# Patient Record
Sex: Female | Born: 1955 | Race: White | Hispanic: No | Marital: Single | Smoking: Current every day smoker
Health system: Southern US, Community
[De-identification: ages and names within clinical notes are randomized; demographics above are authoritative.]

## PROBLEM LIST (undated history)

## (undated) DIAGNOSIS — I10 Essential (primary) hypertension: Secondary | ICD-10-CM

## (undated) DIAGNOSIS — Q782 Osteopetrosis: Secondary | ICD-10-CM

## (undated) DIAGNOSIS — M549 Dorsalgia, unspecified: Secondary | ICD-10-CM

## (undated) DIAGNOSIS — K5900 Constipation, unspecified: Secondary | ICD-10-CM

## (undated) DIAGNOSIS — E079 Disorder of thyroid, unspecified: Secondary | ICD-10-CM

## (undated) DIAGNOSIS — E039 Hypothyroidism, unspecified: Secondary | ICD-10-CM

## (undated) HISTORY — PX: CHOLECYSTECTOMY: SHX55

## (undated) HISTORY — PX: ABDOMINAL HYSTERECTOMY: SHX81

## (undated) HISTORY — DX: Dorsalgia, unspecified: M54.9

## (undated) HISTORY — DX: Disorder of thyroid, unspecified: E07.9

## (undated) HISTORY — DX: Essential (primary) hypertension: I10

## (undated) HISTORY — PX: BACK SURGERY: SHX140

## (undated) HISTORY — DX: Osteopetrosis: Q78.2

---

## 2000-10-06 ENCOUNTER — Other Ambulatory Visit: Admission: RE | Admit: 2000-10-06 | Discharge: 2000-10-06 | Payer: Self-pay | Admitting: Obstetrics and Gynecology

## 2001-10-26 ENCOUNTER — Other Ambulatory Visit: Admission: RE | Admit: 2001-10-26 | Discharge: 2001-10-26 | Payer: Self-pay | Admitting: Obstetrics and Gynecology

## 2002-11-30 ENCOUNTER — Other Ambulatory Visit: Admission: RE | Admit: 2002-11-30 | Discharge: 2002-11-30 | Payer: Self-pay | Admitting: Obstetrics and Gynecology

## 2006-11-24 ENCOUNTER — Ambulatory Visit: Payer: Self-pay | Admitting: Psychiatry

## 2006-11-24 ENCOUNTER — Other Ambulatory Visit (HOSPITAL_COMMUNITY): Admission: RE | Admit: 2006-11-24 | Discharge: 2007-02-22 | Payer: Self-pay | Admitting: Psychiatry

## 2007-01-06 ENCOUNTER — Ambulatory Visit (HOSPITAL_COMMUNITY): Payer: Self-pay | Admitting: Psychiatry

## 2007-02-17 ENCOUNTER — Ambulatory Visit (HOSPITAL_COMMUNITY): Payer: Self-pay | Admitting: Psychiatry

## 2007-04-14 ENCOUNTER — Ambulatory Visit (HOSPITAL_COMMUNITY): Payer: Self-pay | Admitting: Psychiatry

## 2007-06-11 ENCOUNTER — Ambulatory Visit (HOSPITAL_COMMUNITY): Payer: Self-pay | Admitting: Psychiatry

## 2007-06-16 ENCOUNTER — Ambulatory Visit (HOSPITAL_COMMUNITY): Admission: RE | Admit: 2007-06-16 | Discharge: 2007-06-16 | Payer: Self-pay | Admitting: Family Medicine

## 2007-07-16 ENCOUNTER — Ambulatory Visit (HOSPITAL_COMMUNITY): Payer: Self-pay | Admitting: Psychiatry

## 2007-10-06 ENCOUNTER — Ambulatory Visit (HOSPITAL_COMMUNITY): Payer: Self-pay | Admitting: Psychiatry

## 2007-12-03 ENCOUNTER — Ambulatory Visit (HOSPITAL_COMMUNITY): Payer: Self-pay | Admitting: Psychiatry

## 2007-12-09 ENCOUNTER — Ambulatory Visit: Payer: Self-pay | Admitting: Cardiology

## 2007-12-31 ENCOUNTER — Ambulatory Visit (HOSPITAL_COMMUNITY): Payer: Self-pay | Admitting: Psychiatry

## 2008-02-18 ENCOUNTER — Ambulatory Visit (HOSPITAL_COMMUNITY): Payer: Self-pay | Admitting: Psychiatry

## 2008-04-05 ENCOUNTER — Ambulatory Visit (HOSPITAL_COMMUNITY): Payer: Self-pay | Admitting: Psychiatry

## 2008-06-30 ENCOUNTER — Ambulatory Visit (HOSPITAL_COMMUNITY): Payer: Self-pay | Admitting: Psychiatry

## 2008-08-25 ENCOUNTER — Ambulatory Visit (HOSPITAL_COMMUNITY): Payer: Self-pay | Admitting: Psychiatry

## 2008-11-24 ENCOUNTER — Ambulatory Visit (HOSPITAL_COMMUNITY): Payer: Self-pay | Admitting: Psychiatry

## 2008-12-28 ENCOUNTER — Inpatient Hospital Stay (HOSPITAL_COMMUNITY): Admission: RE | Admit: 2008-12-28 | Discharge: 2008-12-30 | Payer: Self-pay | Admitting: Orthopedic Surgery

## 2009-02-16 ENCOUNTER — Ambulatory Visit (HOSPITAL_COMMUNITY): Payer: Self-pay | Admitting: Psychiatry

## 2009-06-13 ENCOUNTER — Ambulatory Visit (HOSPITAL_COMMUNITY): Payer: Self-pay | Admitting: Psychiatry

## 2009-07-25 ENCOUNTER — Ambulatory Visit (HOSPITAL_COMMUNITY)
Admission: RE | Admit: 2009-07-25 | Discharge: 2009-07-25 | Payer: Self-pay | Source: Home / Self Care | Admitting: Family Medicine

## 2009-09-12 ENCOUNTER — Ambulatory Visit (HOSPITAL_COMMUNITY): Payer: Self-pay | Admitting: Psychiatry

## 2009-12-07 ENCOUNTER — Ambulatory Visit (HOSPITAL_COMMUNITY): Payer: Self-pay | Admitting: Psychiatry

## 2010-03-06 ENCOUNTER — Ambulatory Visit (HOSPITAL_COMMUNITY): Payer: Self-pay | Admitting: Psychiatry

## 2010-03-07 ENCOUNTER — Ambulatory Visit: Payer: Self-pay | Admitting: Internal Medicine

## 2010-03-07 ENCOUNTER — Ambulatory Visit (HOSPITAL_COMMUNITY)
Admission: RE | Admit: 2010-03-07 | Discharge: 2010-03-07 | Payer: Self-pay | Source: Home / Self Care | Attending: Internal Medicine | Admitting: Internal Medicine

## 2010-04-27 ENCOUNTER — Other Ambulatory Visit (HOSPITAL_COMMUNITY): Payer: Self-pay

## 2010-05-02 ENCOUNTER — Inpatient Hospital Stay: Admission: RE | Admit: 2010-05-02 | Payer: Self-pay | Source: Home / Self Care | Admitting: Orthopedic Surgery

## 2010-06-05 ENCOUNTER — Other Ambulatory Visit (HOSPITAL_COMMUNITY): Payer: Self-pay

## 2010-06-05 ENCOUNTER — Other Ambulatory Visit (HOSPITAL_COMMUNITY): Payer: Self-pay | Admitting: Orthopedic Surgery

## 2010-06-05 ENCOUNTER — Ambulatory Visit (HOSPITAL_COMMUNITY)
Admission: RE | Admit: 2010-06-05 | Discharge: 2010-06-05 | Disposition: A | Discharge status: Home / Self Care | Payer: Worker's Compensation | Source: Ambulatory Visit | Attending: Orthopedic Surgery | Admitting: Orthopedic Surgery

## 2010-06-05 ENCOUNTER — Encounter (HOSPITAL_COMMUNITY)
Admission: RE | Admit: 2010-06-05 | Discharge: 2010-06-05 | Disposition: A | Discharge status: Home / Self Care | Payer: Worker's Compensation | Source: Ambulatory Visit | Attending: Orthopedic Surgery | Admitting: Orthopedic Surgery

## 2010-06-05 ENCOUNTER — Encounter (HOSPITAL_COMMUNITY): Payer: Self-pay | Admitting: Psychiatry

## 2010-06-05 DIAGNOSIS — Z01818 Encounter for other preprocedural examination: Secondary | ICD-10-CM | POA: Insufficient documentation

## 2010-06-05 DIAGNOSIS — M549 Dorsalgia, unspecified: Secondary | ICD-10-CM | POA: Insufficient documentation

## 2010-06-05 DIAGNOSIS — Z01810 Encounter for preprocedural cardiovascular examination: Secondary | ICD-10-CM | POA: Insufficient documentation

## 2010-06-05 DIAGNOSIS — Z01812 Encounter for preprocedural laboratory examination: Secondary | ICD-10-CM | POA: Insufficient documentation

## 2010-06-05 LAB — CBC
HCT: 40.2 % (ref 36.0–46.0)
MCH: 30.3 pg (ref 26.0–34.0)
RBC: 4.49 MIL/uL (ref 3.87–5.11)
RDW: 13.6 % (ref 11.5–15.5)

## 2010-06-05 LAB — SURGICAL PCR SCREEN: Staphylococcus aureus: NEGATIVE

## 2010-06-05 LAB — BASIC METABOLIC PANEL
CO2: 26 mEq/L (ref 19–32)
Calcium: 9.9 mg/dL (ref 8.4–10.5)
Chloride: 104 mEq/L (ref 96–112)
Sodium: 139 mEq/L (ref 135–145)

## 2010-06-07 ENCOUNTER — Encounter (INDEPENDENT_AMBULATORY_CARE_PROVIDER_SITE_OTHER): Payer: Worker's Compensation | Admitting: Psychiatry

## 2010-06-07 DIAGNOSIS — F3289 Other specified depressive episodes: Secondary | ICD-10-CM

## 2010-06-07 DIAGNOSIS — F329 Major depressive disorder, single episode, unspecified: Secondary | ICD-10-CM

## 2010-06-13 ENCOUNTER — Inpatient Hospital Stay (HOSPITAL_COMMUNITY): Payer: Worker's Compensation

## 2010-06-13 ENCOUNTER — Inpatient Hospital Stay (HOSPITAL_COMMUNITY)
Admission: RE | Admit: 2010-06-13 | Discharge: 2010-06-15 | DRG: 455 | Disposition: A | Discharge status: Home Health | Payer: Worker's Compensation | Source: Ambulatory Visit | Attending: Orthopedic Surgery | Admitting: Orthopedic Surgery

## 2010-06-13 DIAGNOSIS — T84498A Other mechanical complication of other internal orthopedic devices, implants and grafts, initial encounter: Principal | ICD-10-CM | POA: Diagnosis present

## 2010-06-13 DIAGNOSIS — E039 Hypothyroidism, unspecified: Secondary | ICD-10-CM | POA: Diagnosis present

## 2010-06-13 DIAGNOSIS — Z472 Encounter for removal of internal fixation device: Secondary | ICD-10-CM

## 2010-06-13 DIAGNOSIS — F17200 Nicotine dependence, unspecified, uncomplicated: Secondary | ICD-10-CM | POA: Diagnosis present

## 2010-06-13 DIAGNOSIS — Y831 Surgical operation with implant of artificial internal device as the cause of abnormal reaction of the patient, or of later complication, without mention of misadventure at the time of the procedure: Secondary | ICD-10-CM | POA: Diagnosis present

## 2010-06-13 DIAGNOSIS — Y92009 Unspecified place in unspecified non-institutional (private) residence as the place of occurrence of the external cause: Secondary | ICD-10-CM

## 2010-06-13 DIAGNOSIS — Z01810 Encounter for preprocedural cardiovascular examination: Secondary | ICD-10-CM

## 2010-06-13 DIAGNOSIS — M47817 Spondylosis without myelopathy or radiculopathy, lumbosacral region: Secondary | ICD-10-CM | POA: Diagnosis present

## 2010-06-13 DIAGNOSIS — I10 Essential (primary) hypertension: Secondary | ICD-10-CM | POA: Diagnosis present

## 2010-06-13 DIAGNOSIS — Z01812 Encounter for preprocedural laboratory examination: Secondary | ICD-10-CM

## 2010-06-13 LAB — TYPE AND SCREEN: Antibody Screen: NEGATIVE

## 2010-06-15 ENCOUNTER — Inpatient Hospital Stay (HOSPITAL_COMMUNITY): Payer: Worker's Compensation

## 2010-06-19 NOTE — Discharge Summary (Signed)
  NAME:  Shannon Glover, Shannon Glover                 ACCOUNT NO.:  [id number]  MEDICAL RECORD NO.:  [id number]           PATIENT TYPE:  I  LOCATION:  5023                         FACILITY:  [facility]  PHYSICIAN:  Alvy Beal, MD    DATE OF BIRTH:  June 22, 1955  DATE OF ADMISSION:  06/13/2010 DATE OF DISCHARGE:  06/15/2010                              DISCHARGE SUMMARY   ADMITTING DIAGNOSES: 1. Failed back syndrome. 2. 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  DISCHARGE DIAGNOSES: 1. Failed back syndrome. 2. 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  HISTORY:  This is a very pleasant 55 year old woman who has had longstanding significant back pain.  Approximately year and a half to 2 years ago, she had a TLIF procedure done and did initially very well. In October 2010, she had her TLIF.  The patient subsequently started getting significant more pain.  She was found on followup to have a pseudoarthrosis at L5-S1 along with adjacent segment degenerative disease at L4-5.  After attempts at conservative management and failed to alleviate his symptoms, the decision was made to take the patient back to the operating room for revision fusion and extension to L4-5. All appropriate risks, benefits, and alternatives were discussed and consent was obtained.  Please refer to preoperative H and P from my office notes which are included in her hospital chart for specifics on her past medical, surgical, family, and social history.  HOSPITAL COURSE:  The patient had an uneventful surgery.  She had an exquisite at L4-5 with the insertion of new screws, exploration of her fusion, and revision spinal fusion L5-S1.  She had a new L4-5 posterolateral fusion.  The patient tolerated the procedure well.  There was no adverse intraoperative events.  On postop day #1, she was up and ambulating.  She had incisional pain but overall she is  doing quite well.  The patient was voiding spontaneously after the Foley was removed and was ambulating.  She will be discharged home with appropriate pain medications, muscle relaxers, and instructions.  She will follow up with me in 2 weeks for reevaluation and wound check.     Alvy Beal, MD     DDB/MEDQ  D:  06/15/2010  T:  06/15/2010  Job:  [id number]  Electronically Signed by Venita Lick MD on 06/19/2010 08:43:15 AM

## 2010-06-19 NOTE — Op Note (Signed)
NAME:  Shannon Glover, Shannon Glover                 ACCOUNT NO.:  [id number]  MEDICAL RECORD NO.:  [id number]           PATIENT TYPE:  I  LOCATION:  5023                         FACILITY:  [facility]  PHYSICIAN:  Alvy Beal, MD    DATE OF BIRTH:  13-May-1955  DATE OF PROCEDURE:  06/13/2010 DATE OF DISCHARGE:                              OPERATIVE REPORT   PREOPERATIVE DIAGNOSES: 1. Pseudoarthrosis from previous transforaminal lumbar interbody     fusion L5-S1 with failed back syndrome. 2. Adjacent segment degenerative facet arthrosis and back pain L4-5.  POSTOPERATIVE DIAGNOSES: 1. Pseudoarthrosis from previous transforaminal lumbar interbody     fusion L5-S1 with failed back syndrome. 2. Adjacent segment degenerative facet arthrosis and back pain L4-5.  OPERATIVE PROCEDURE: 1. Lateral retroperitoneal exposure for complete diskectomy (XLIF),     anterolateral interbody fusion L4-5 with the NuVasive lordotic size     10 x 50 cage packed with Osteocel and DBX. 2. Exploration of posterolateral fusion L5-S1. 3. Removal of pedicle screws L5-S1. 4. Reinsertion of new pedicle screws L5-S1 and extension to L4. 5. Posterolateral arthrodesis with chronOS and DBX bone L4-S1.  COMPLICATIONS:  None.  CONDITION:  Stable.  Neuromonitoring, intraoperatively remained within normal limits.  No adverse events.  HISTORY:  This is a very pleasant 54-year woman who approximately a year and a half to two years ago underwent ago a TLIF at L5-S1.  Initially, she had improvement, but then starting getting increasing back pain. Despite appropriate conservative care and management, her pain persisted.  A followup CT scan demonstrated a pseudoarthrosis at L5-S1. There is also evidence of advancing degenerative changes at L4-5.  As a result of her increased pain and failure to fuse, the decision was made to take her back to the operating room.  Initially, preoperatively, I thought I would do a revision TLIF at  L5-S1 and XLIF at L4-5.  However, intraoperatively, I noted that if I attempted to do the TLIF and I am unable to get a grafting or I cannot mobilize the previously placed cage to place a new cage, that I would have compromised my ability to put a posterolateral fusion mass, given this I elected not to do the TLIF at L5-S1.  In addition, there was a significant scar tissue formation and I was concerned about my ability to mobilize the neural while the same time preventing any injury to the neural structures.  Furthermore, based on the CT scan, there was no evidence of any significant foraminal compromise to warrant a decompressive procedure.  Given this, I elected to proceed with just doing a posterolateral arthrodesis rather than trying to revise the TLIF at L5-S1.  OPERATIVE NOTE:  The patient was brought to the operating room, placed supine on the operating table.  After successful induction of general anesthesia and endotracheal intubation, TED, SCDs and Foley were inserted.  The patient was turned onto the left decubitus side with the left side up.  All bony prominence were well padded.  She was taped down appropriately with the iliac crest just at the break of the bed. Appropriate neuromonitoring, needles were then  inserted, and then the bed was broken.  I then taped the patient into position and confirmed satisfactory alignment of the L4-5 level in both the AP and lateral planes using fluoroscopy.  Once this was done, the flank was prepped and draped in a standard fashion.  A time-out was then done to confirm patient procedure and the extremity and all other pertinent important data.  Once this was done, I made a 3-inch incision centered over the L4-5 disk space.  I dissected through the subcutaneous tissue down to the fascia of the external oblique, I incised this.  I then made a counter incision approximately one fingerbreadth away from the mid portion of my first incision  posteriorly.  I then dissected through to the posterior fascia, I popped through the posterior facet into the rectus sheath.  At this point, with my finger I was able to palpate the ventral aspect of the transverse process and began to sweep the retroperitoneal contents anteriorly.  I then brought my finger up to the primary incision and I identified it and then using my two incision technique safely guided the initial trocar down to the psoas muscle.  Once it was on top of the psoas, I connected it to the neuromonitoring and advanced in a circular fashion until I was able to identified a safe position to advance through the psoas and not to injure the lumbar plexus.  Once I was able to do this, I went transpsoas down to the disk space.  I confirmed satisfactory position in both the AP and lateral planes.  I then sequentially dilated again checking circumferentially to ensure that I was not endangering the lumbar plexus.  With the plexus and femoral nerve projected, I placed a self-retaining retractors in and then I tapped it into the disk space so would not move.  I then probed underneath each of the disk underneath to the blade to ensure that they were still not adversely affecting the lumbar plexus.  I then opened it up advanced in anteriorly, so I had an excellent visualization of the disk space.  At this point, I then incised the annulus with a 15 blade scalpel, then used a combination of curettes, push-pull, Kerrison rongeurs, pituitary rongeurs, to remove all the disk material.  Once I had a complete diskectomy and I could visualize the endplates, I scraped him again to ensure I had bleeding endplates.  I then used my Cobb to advance across and released the contralateral annulus.  Once this was done, I measured with trial implants and obtained a 10 lordotic 50-mm long NuVasive anterolateral cage.  This was packed with a combination of Osteocel and DBX.  I malleted this to the  appropriate depth.  I had an excellent fixation and was firmly secured.  Once this was completed, I then irrigated the wound copiously with normal saline, used bipolar electrocautery to obtain hemostasis and removed the retractor.  I then closed the fascia of the external oblique and then closed with a #1 Vicryl suture, superficial 2-0 Vicryl suture and 3-0 Monocryl for the skin.  At this time, the second incision I made from my finger to advance bluntly, I irrigated, closed with a layer of 2- 0 Vicryl and then a 3-0 Monocryl.  At this point in time, with the patient's lateral fusion complete, I removed the drapes, I placed the patient supine on the operating table and then brought in the supine Bendon frame.  The patient was transferred into the spine Jean Rosenthal  frame in the prone position, and the arms were placed overhead and well padded.  At this point, the back was prepped and draped in a standard fashion.  A second time-out was done to confirm this portion of the procedure.  On the left-hand side which was the previous I extended the [facility] approach for L5-S1 and dissected down until I could see the lateral aspect of the L3-4 facet complex and the L4 transverse process.  I used a high-speed bur to decorticate the transverse process and then I placed a Jamshidi needle right on the border where the transverse process met the facet.  I then advanced the Jamshidi needle through the pedicle and into the vertebral body.  This Jamshidi was advanced only with direct visualization, but also using neuromonitoring assistance.  A guidepin was placed to the Jamshidi, I then tapped with a 5-0 tap and then placed on the left-hand side a 45-mm 6.5 diameter pedicle screw.  I had good fixation, good purchase, it was satisfactory in the AP and lateral fluoro view and with neuromonitoring.  I repeated this procedure on the contralateral.  I then identified the L5 and S1 pedicle screws. I removed both  of these screws and explored the screw holes.  Both screw holes were satisfactory.  Because of the position of the L5 screw hole on the left side,  I elected not to reposition it because it was going to make connecting to the rod quite difficult.  I therefore packed it with bone graft.  At S1, I tapped with a 7-0 tap and placed an 8-0 x 40 mm long screw.  This had excellent purchase and when I stimulated both screws, there was no evidence of neural compromise.  At this point, I used a high-speed bur to decorticate the lateral recess and packed significant amount of bone graft which consisted of chronOS and DBX and the remaining portion of Osteocel into the posterolateral corner.  I then irrigated the wound copiously, closed the deep fascia with interrupted #1 Vicryl sutures, superficial 2-0 Vicryl suture and a 3-0 Monocryl for the skin.  On the contralateral side, I used the same technique, expanded up the [place] approach, dissected down to the lateral aspect and then identified the L5 and S1 pedicle screws and the L4 transverse process. I then used the Jamshidi needle and placed the needle through the pedicle and into the vertebral body.  Again, using fluoro and direct visualization, the Jamshidi was properly positioned.  A guide pin was placed and then I tapped with the 5-0 tap and placed a 40 mm 6.5 diameter screw.  At L5, I tapped with a 7-0 screw and placed an 8-0 x 40 mm length pedicle screw and I placed the same size pedicle screw at S1. At all times, after tapping, I did palpate with a ball tipped feeler and there was no evidence of breach and I stimulated each of the screws and I had satisfactory readings.  At this point, I contemplated proceeding with the decompression in order to do a TLIF.  I had great concern about my ability to get an adequate sized interbody graft into the space given the location of her current graft.  I was concerned even though there was no subsides  of my regional 51 graft, I was greatly concerned that if I could not get an adequately sized interbody graft, then I would have compromised my ability to put posterolateral bone.  At this point, with the posterolateral aspect widely  exposed, I elected not to do the interbody fixation.  I decorticated the transverse process of L5 and the sacral ala and the L4 and packed a significant amount of bone graft into the posterolateral aspect.  This in combination with a much larger pedicle screws, I felt as though I could get a solid fusion in the posterolateral aspect.  At this point, I irrigated, closed in the same fashion as I did in the contralateral side.  Again, because of concerns about length of surgery, the ability to get an adequate size graft, to get the interbody fusion, I elected not to place a new TLIF graft at L5-S1.  At this point, the patient was extubated, transferred to the PACU without incident.  At the end of the case,  all needle, sponge counts were correct.  The patient tolerated procedure well.  No adverse intraoperative events.     Alvy Beal, MD     DDB/MEDQ  D:  06/13/2010  T:  06/14/2010  Job:  [id number]  Electronically Signed by Venita Lick MD on 06/19/2010 08:43:13 AM

## 2010-06-21 LAB — CBC
HCT: 41.2 % (ref 36.0–46.0)
Hemoglobin: 14 g/dL (ref 12.0–15.0)
MCHC: 34 g/dL (ref 30.0–36.0)
MCV: 92.3 fL (ref 78.0–100.0)
RBC: 4.47 MIL/uL (ref 3.87–5.11)

## 2010-06-21 LAB — BASIC METABOLIC PANEL
CO2: 27 mEq/L (ref 19–32)
Calcium: 10.1 mg/dL (ref 8.4–10.5)
Chloride: 101 mEq/L (ref 96–112)
GFR calc Af Amer: >60 mL/min (ref >60)
Glucose, Bld: 99 mg/dL (ref 70–99)
Sodium: 136 mEq/L (ref 135–145)

## 2010-06-21 LAB — ABO/RH: ABO/RH(D): A POS

## 2010-07-25 ENCOUNTER — Other Ambulatory Visit (HOSPITAL_COMMUNITY): Payer: Self-pay | Admitting: Orthopedic Surgery

## 2010-07-25 ENCOUNTER — Ambulatory Visit (HOSPITAL_COMMUNITY)
Admission: RE | Admit: 2010-07-25 | Discharge: 2010-07-25 | Disposition: A | Discharge status: Home / Self Care | Payer: Worker's Compensation | Source: Ambulatory Visit | Attending: Orthopedic Surgery | Admitting: Orthopedic Surgery

## 2010-07-25 DIAGNOSIS — Z981 Arthrodesis status: Secondary | ICD-10-CM

## 2010-07-25 DIAGNOSIS — M545 Low back pain, unspecified: Secondary | ICD-10-CM | POA: Insufficient documentation

## 2010-07-25 DIAGNOSIS — M549 Dorsalgia, unspecified: Secondary | ICD-10-CM

## 2010-08-30 ENCOUNTER — Encounter (HOSPITAL_COMMUNITY): Payer: Self-pay | Admitting: Psychiatry

## 2010-08-30 ENCOUNTER — Encounter (INDEPENDENT_AMBULATORY_CARE_PROVIDER_SITE_OTHER): Payer: Worker's Compensation | Admitting: Psychiatry

## 2010-08-30 DIAGNOSIS — F331 Major depressive disorder, recurrent, moderate: Secondary | ICD-10-CM

## 2010-10-03 IMAGING — CR DG LUMBAR SPINE 2-3V
2 series · 2 of 2 positions shown · non-contrast
Comparison: None

CLINICAL DATA: Preop L5-S1.

LUMBAR SPINE - 2-3 VIEW

[view not recorded (1 of 2)]
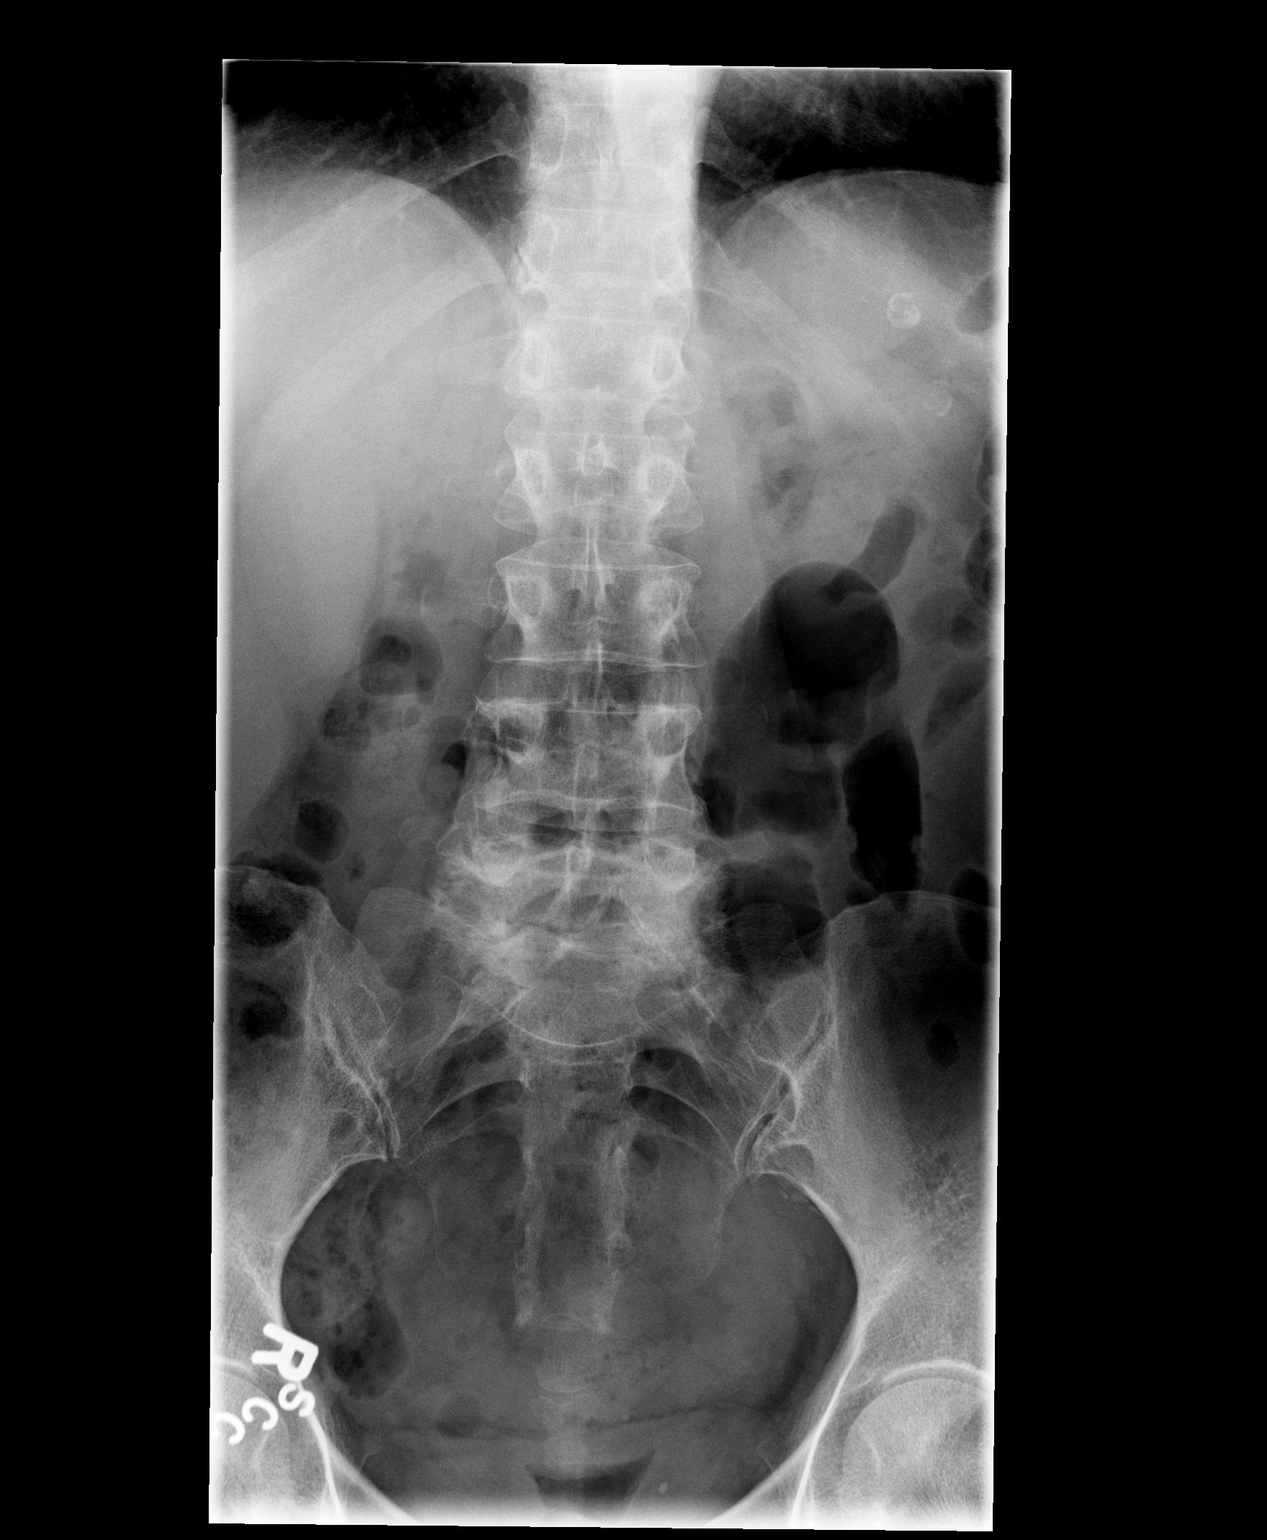

[view not recorded (2 of 2)]
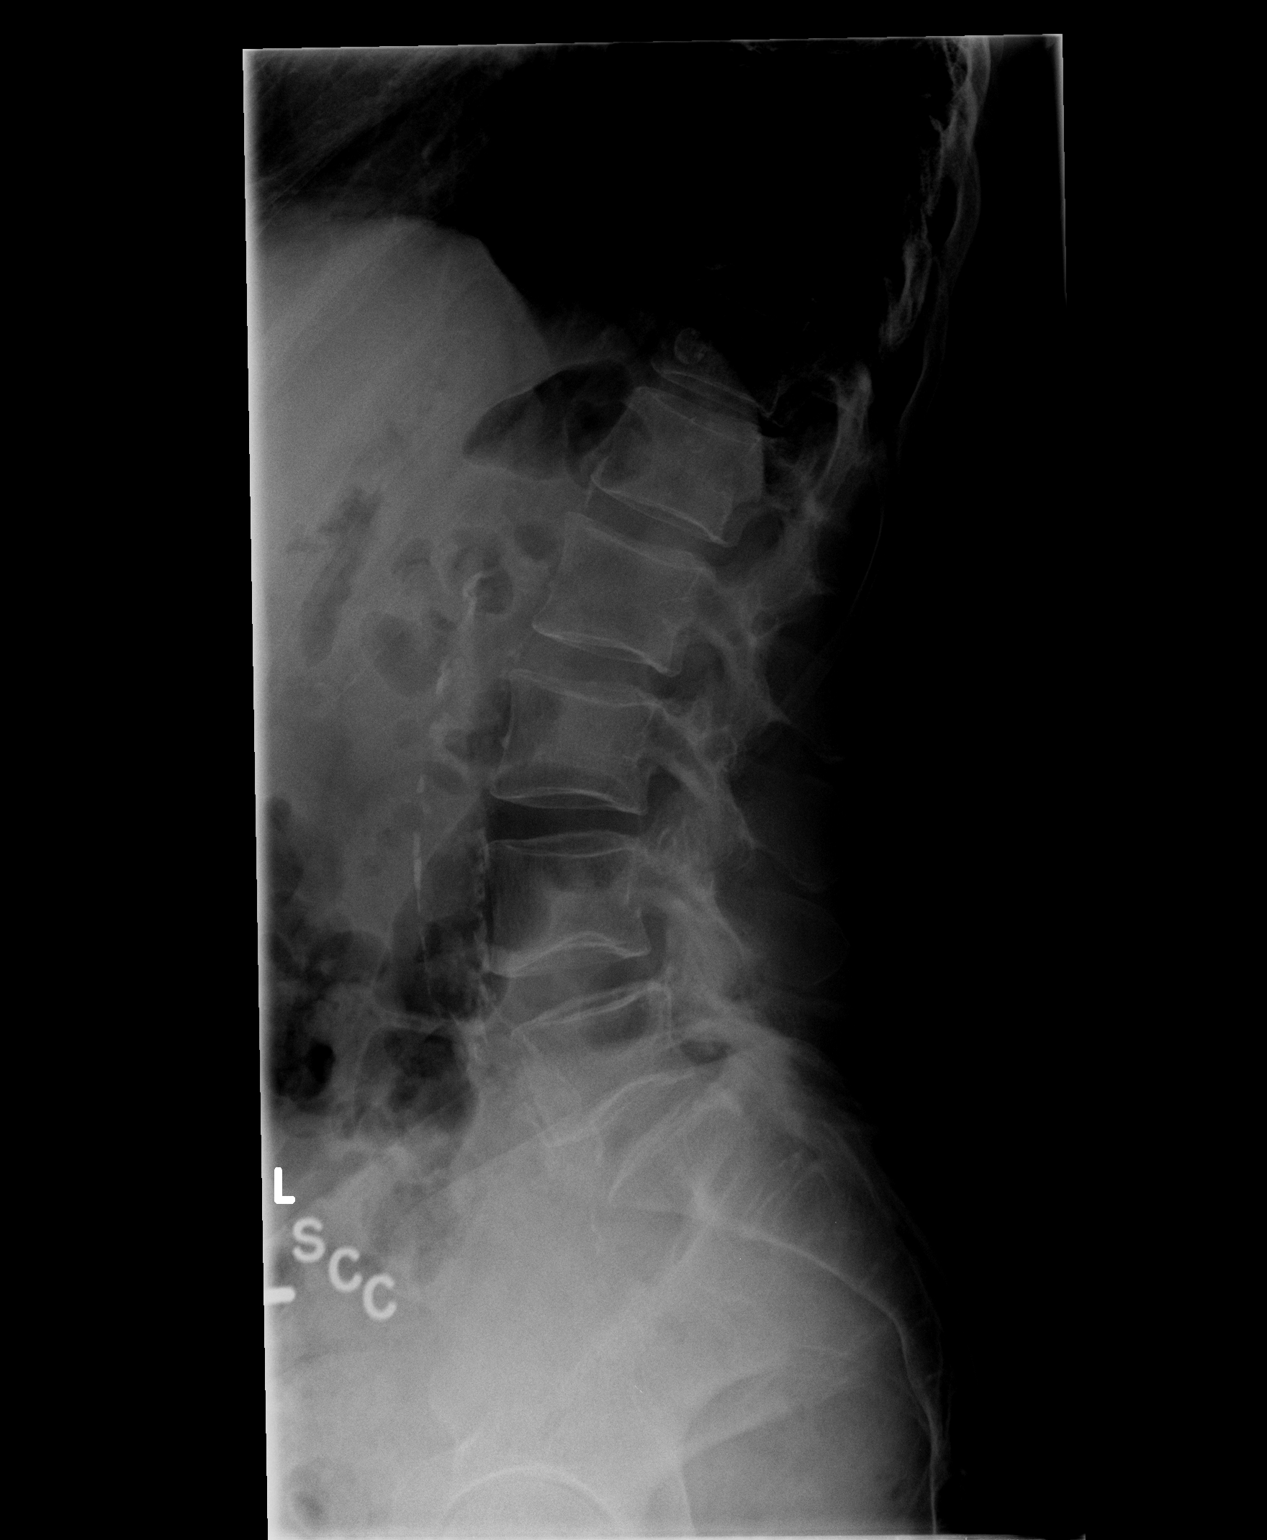

[2 of 2 positions shown; findings below may reference images not displayed]

FINDINGS: There are five lumbar-type vertebral bodies.  Mild facet
disease in the lower lumbar spine.  No fracture or malalignment.
SI joints are unremarkable.
IMPRESSION: No acute findings.

Degenerative facet disease in the lower lumbar spine.

## 2010-10-06 IMAGING — CR DG LUMBAR SPINE 2-3V
2 series · 2 of 2 positions shown · non-contrast
Comparison: 12/26/2008

CLINICAL DATA: L5-S1 fusion.

LUMBAR SPINE - 2-3 VIEW

[t l-spine a.p.]
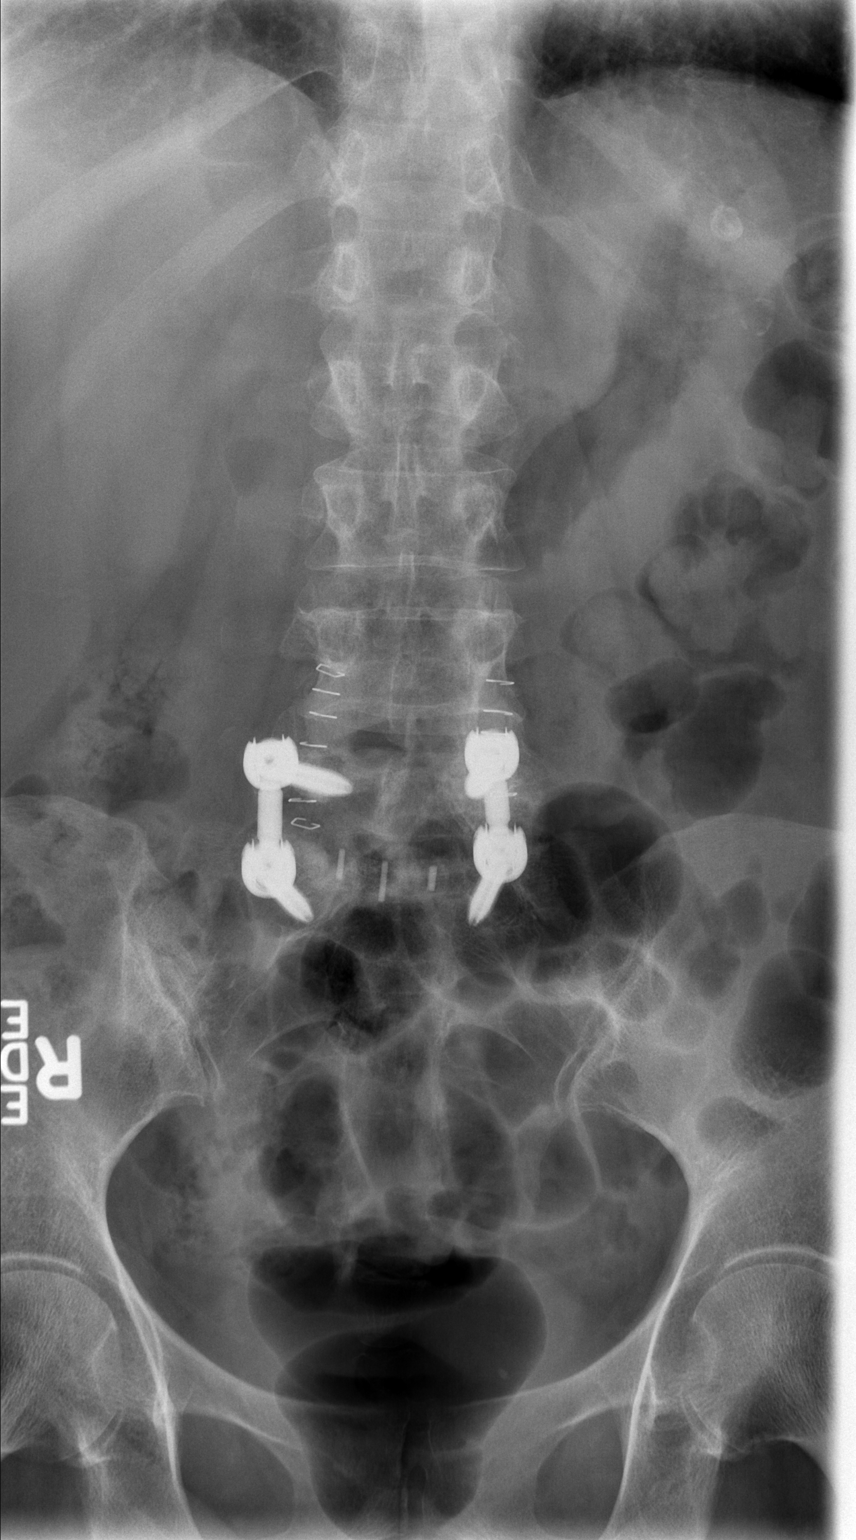

[t l-spine lat]
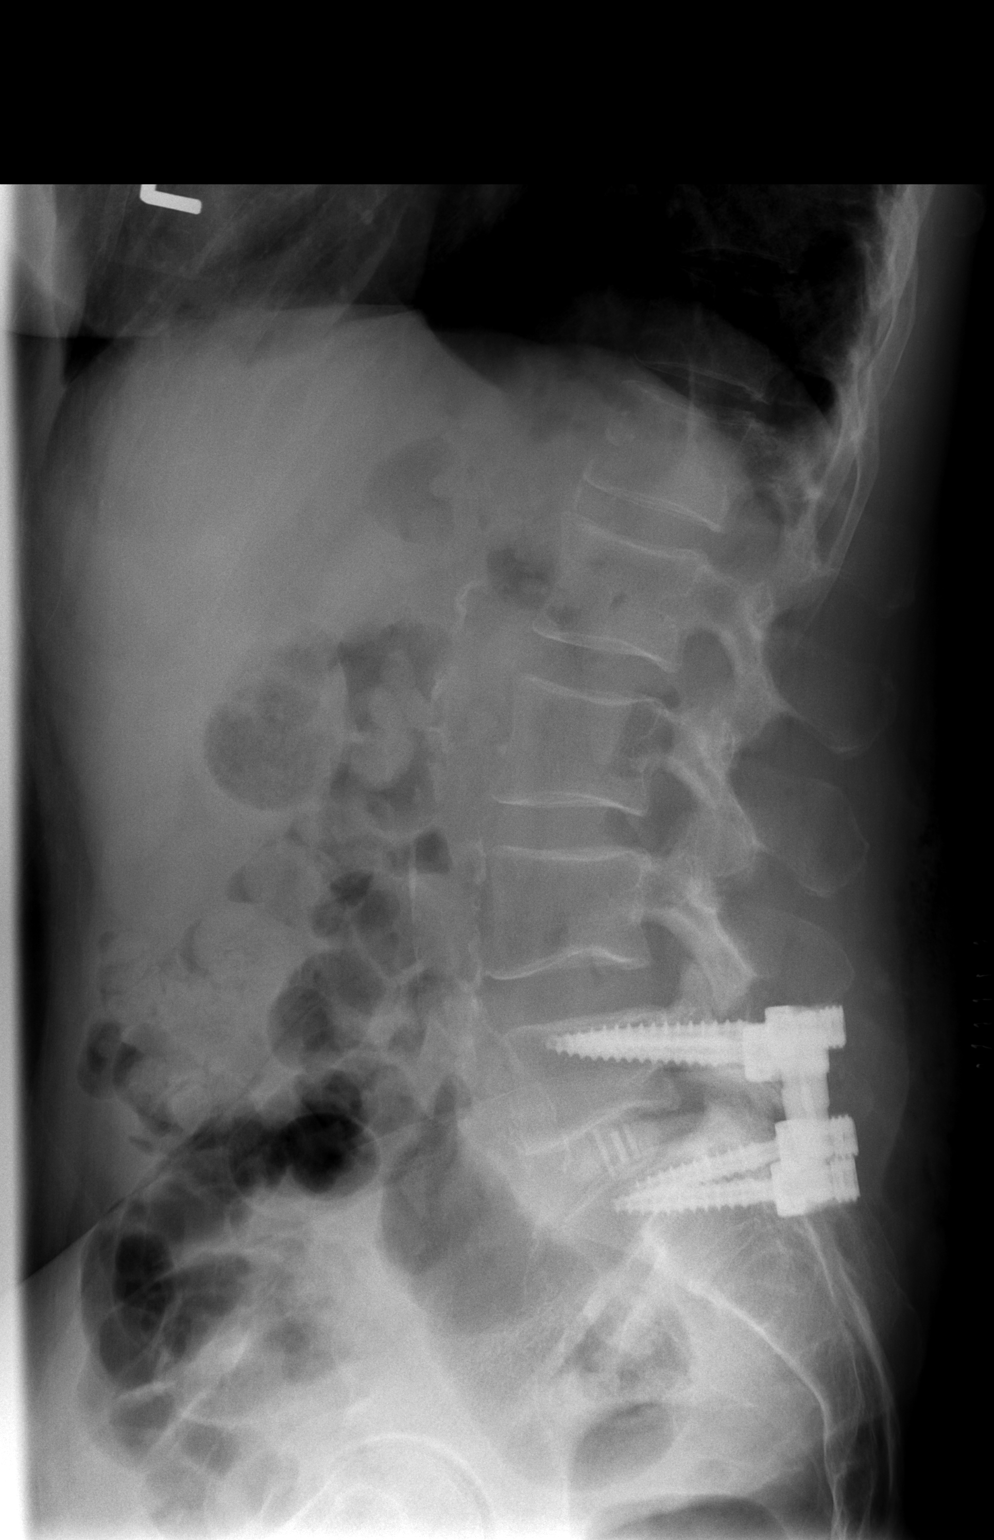

[2 of 2 positions shown; findings below may reference images not displayed]

FINDINGS: Postoperative changes noted from L5-S1 posterior fusion.
No hardware or bony complicating feature.  Stable alignment.

Atherosclerotic disease in the aorta without evidence of aneurysm.
IMPRESSION: Posterior fusion L5-S1.  No complicating feature.

## 2010-11-22 ENCOUNTER — Encounter (HOSPITAL_COMMUNITY): Payer: Self-pay | Admitting: Psychiatry

## 2010-12-13 ENCOUNTER — Encounter (INDEPENDENT_AMBULATORY_CARE_PROVIDER_SITE_OTHER): Payer: Worker's Compensation | Admitting: Psychiatry

## 2010-12-13 DIAGNOSIS — F329 Major depressive disorder, single episode, unspecified: Secondary | ICD-10-CM

## 2010-12-13 DIAGNOSIS — F3289 Other specified depressive episodes: Secondary | ICD-10-CM

## 2011-02-09 ENCOUNTER — Other Ambulatory Visit (HOSPITAL_COMMUNITY): Payer: Self-pay | Admitting: Psychiatry

## 2011-02-28 ENCOUNTER — Encounter (HOSPITAL_COMMUNITY): Payer: Self-pay | Admitting: Psychiatry

## 2011-02-28 ENCOUNTER — Ambulatory Visit (INDEPENDENT_AMBULATORY_CARE_PROVIDER_SITE_OTHER): Payer: Worker's Compensation | Admitting: Psychiatry

## 2011-02-28 VITALS — Wt 129.0 lb

## 2011-02-28 DIAGNOSIS — F329 Major depressive disorder, single episode, unspecified: Secondary | ICD-10-CM

## 2011-02-28 MED ORDER — BUPROPION HCL ER (SR) 150 MG PO TB12: 150.0000 mg | ORAL_TABLET | Freq: Every day | ORAL | Status: DC

## 2011-02-28 NOTE — Progress Notes (Signed)
Shannon [id number]  Subjective Patient came in for her followup appointment. She is under a lot of the stress due to financial burden. She is in the process of losing her insurance. She was told to get COBRA however patient could not afford COBRA. She has been not working since March. She has a back surgery in March and that did not permit her to go back to work. She is now thinking to applying for disability. She takes pain medication however there are days that she continued to have struggled to move her affect. She does not want to increase her Wellbutrin to recently reported that her sleep has not better. She was given trazodone by primary care physician which she has not started yet. She's wondering if she can take trazodone for sleep. She reported no side effects of Wellbutrin. She denies any agitation anger or mood swings.  Mental Status Examination Patient is casually dressed and fairly groomed. She described her mood as depressed and anxious. Her affect is constricted. She denies any active or passive suicidal thoughts or homicidal thoughts. Her attention and concentration is fair. Her speech is slow but linear and goal-directed. There no psychotic symptoms present. She denies any auditory or visual hallucination. She's alert and oriented x3. Her insight judgment and impulse control is okay  Current Medication Current outpatient prescriptions:buPROPion (WELLBUTRIN SR) 150 MG 12 hr tablet, Take 1 tablet (150 mg total) by mouth daily., Disp: 90 tablet, Rfl: 0;  fish oil-omega-3 fatty acids 1000 MG capsule, Take 2 g by mouth daily.  , Disp: , Rfl: ;  HYDROcodone-acetaminophen (NORCO) 10-325 MG per tablet, Take 1 tablet by mouth every 8 (eight) hours as needed.  , Disp: , Rfl:  levothyroxine (SYNTHROID, LEVOTHROID) 100 MCG tablet, Take 100 mcg by mouth daily.  , Disp: , Rfl: ;  spironolactone-hydrochlorothiazide (ALDACTAZIDE) 25-25 MG per tablet, Take 1 tablet by mouth daily.  , Disp: ,  Rfl: ;  traZODone (DESYREL) 50 MG tablet, Take 50 mg by mouth at bedtime.  , Disp: , Rfl: ;  Vitamin D, Ergocalciferol, (DRISDOL) 50000 UNITS CAPS, Take 50,000 Units by mouth.  , Disp: , Rfl:    Assessment Maj. depressive disorder  Plan I will continue her Wellbutrin at present dose but recommended to try trazodone 50 mg when necessary as needed for insomnia. I have explained risks and benefits of medication. I recommended to call us if she continued to have insomnia and if she feels her depression is getting worse. I explained risks and benefits of medication I will see her again in 6 weeks

## 2011-03-07 ENCOUNTER — Encounter (HOSPITAL_COMMUNITY): Payer: Self-pay | Admitting: Psychiatry

## 2011-04-09 ENCOUNTER — Ambulatory Visit (INDEPENDENT_AMBULATORY_CARE_PROVIDER_SITE_OTHER): Payer: Worker's Compensation | Admitting: Psychiatry

## 2011-04-09 DIAGNOSIS — F331 Major depressive disorder, recurrent, moderate: Secondary | ICD-10-CM

## 2011-04-09 MED ORDER — TRAZODONE HCL 100 MG PO TABS: 100.0000 mg | ORAL_TABLET | Freq: Every day | ORAL | Status: DC

## 2011-04-09 NOTE — Progress Notes (Signed)
Shannon [id number]  Subjective Patient came today for her followup appointment. She is now taking trazodone 50 mg which is helping her sleep however she continued to endorse at times racing thoughts and nervousness. She has recently seen her pain doctor however no new medication is added he did she continues to feel stress about her finances however she is relief as health Department agreed to provide medication.  She has been not working since March. She has a back surgery in March and that did not permit her to go back to work. She is still not sure about applying for disability. She reported no side effects of trazodone and wondering if the dose can be increased 200 mg. She denies any agitation anger or mood swings.  Mental Status Examination Patient is casually dressed and fairly groomed. She described her mood as depressed and anxious. Her affect is constricted. She denies any active or passive suicidal thoughts or homicidal thoughts. Her attention and concentration is fair. Her speech is slow but linear and goal-directed. There no psychotic symptoms present. She denies any auditory or visual hallucination. She's alert and oriented x3. Her insight judgment and impulse control is okay  Assessment Maj. depressive disorder  Plan I will continue her Wellbutrin at present dose but recommended to increase trazodone 100 mg. At this time patient is tolerating 50 mg without any side effects. I have explained risks and benefits of thinks sedation will increase trazodone. I recommended to call us if she has any question or concern about the medication or if she feels worsening of her symptoms. I will see her again in 2 months

## 2011-04-11 ENCOUNTER — Ambulatory Visit (HOSPITAL_COMMUNITY): Payer: Self-pay | Admitting: Psychiatry

## 2011-06-06 ENCOUNTER — Encounter (HOSPITAL_COMMUNITY): Payer: Self-pay | Admitting: Psychiatry

## 2011-06-06 ENCOUNTER — Ambulatory Visit (INDEPENDENT_AMBULATORY_CARE_PROVIDER_SITE_OTHER): Payer: Managed Care, Other (non HMO) | Admitting: Psychiatry

## 2011-06-06 VITALS — Wt 127.0 lb

## 2011-06-06 DIAGNOSIS — F329 Major depressive disorder, single episode, unspecified: Secondary | ICD-10-CM

## 2011-06-06 NOTE — Progress Notes (Signed)
Shannon [id number]  Chief complaint I am out of my Wellbutrin and hydrocodone.  History of present illness. Patient is 56 year old Caucasian female who came for her followup appointment. She is not taking Wellbutrin and hydrocodone. Her pain doctor refused to give pain medication due to some license issue. Patient endorsed significant withdrawal symptoms as she has to stop all of sudden. However, she was getting Lidoderm patch to the workers compensation. She's also not able to get Wellbutrin due to increased cost and unable to provide by health Department. She is taking trazodone 100 mg. She is feeling some sad and depression but does not want any other antidepressant. She likes trazodone and waiting for her insurance, so that she can go back on Wellbutrin. She is recently approved for her disability. However she is still waiting for final letter. Overall she is sleeping fair. She denies any agitation anger or mood swings. She denies any crying spells, but he still endorse social isolation and depressive thoughts. She denies any side effects of medication. She denies any active or passive suicidal thinking.  Current psychiatric medication. Trazodone 100 mg at bedtime.  Medical history. Patient has history of thyroid disease, hypertension, osteoporosis, and back pain. She see Dr.Yvas in [place].   Mental status examination. Patient is casually dressed and fairly groomed. She described her mood as depressed and anxious. Her affect is constricted. She is pleasant cooperative and maintained good eye contact. She denies any active or passive suicidal thoughts or homicidal thoughts. Her attention and concentration is fair. Her speech is slow but linear and goal-directed. There no psychotic symptoms present. She denies any auditory or visual hallucination. She's alert and oriented x3. Her insight judgment and impulse control is okay  Assessment Axis I Maj. depressive disorder Axis II  deferred. Axis III hypertension, thyroid problems, osteoporosis, and back pain. Axis IV mild to moderate  Plan At this time. Patient like to continue her trazodone 100 mg at bedtime. Patient has given refills from her pain doctor. Recently. She does not need any new medication. Patient is tolerating trazodone without any side effects. She will consider getting Wellbutrin once her insurance get finally approved. I recommended to call us if she feels worsening of her symptoms or any time having suicidal thinking and homicidal thinking. I will see her again in 6 weeks.

## 2011-07-18 ENCOUNTER — Encounter (HOSPITAL_COMMUNITY): Payer: Self-pay | Admitting: Psychiatry

## 2011-07-18 ENCOUNTER — Ambulatory Visit (INDEPENDENT_AMBULATORY_CARE_PROVIDER_SITE_OTHER): Payer: Managed Care, Other (non HMO) | Admitting: Psychiatry

## 2011-07-18 DIAGNOSIS — F331 Major depressive disorder, recurrent, moderate: Secondary | ICD-10-CM

## 2011-07-18 DIAGNOSIS — F3289 Other specified depressive episodes: Secondary | ICD-10-CM

## 2011-07-18 DIAGNOSIS — F329 Major depressive disorder, single episode, unspecified: Secondary | ICD-10-CM

## 2011-07-18 DIAGNOSIS — F32A Depression, unspecified: Secondary | ICD-10-CM | POA: Insufficient documentation

## 2011-07-18 MED ORDER — TRAZODONE HCL 100 MG PO TABS: 100.0000 mg | ORAL_TABLET | Freq: Every day | ORAL | Status: DC

## 2011-07-18 NOTE — Progress Notes (Signed)
Chief complaint Medication management followup.  History of present illness. Patient is 56 year old Caucasian female who came for her followup appointment.  She has recently seen her pain doctor Dr. Terese Door .  She was given Motrin and Lidoderm patch which is helping her pain.  She likes trazodone as she is able to sleep better.  She denies any crying spells however she continues to have chronic back pain.  She was taking Wellbutrin however she lost insurance and could not afford Wellbutrin.  She is hoping that her case will be several in September.  She was involved in a back injury.  She's recently approved for Social Security disability however she has not seen any payment.  She's excited that she is going to [place] to visit her family.  Overall she is stable on her trazodone.  She denies any crying spells agitation anger or mood swings.  She sleeps better.  She's not drinking or using any illegal substance.  Current psychiatric medication. Trazodone 100 mg at bedtime.    Past psychiatric history Patient has been seeing in this office since 2008.  She had overdose on Xanax and Vicodin in 2008.  She did intensive outpatient program.  She denies any history of psychiatric inpatient treatment.  In the past she had tried Zoloft and Wellbutrin.  She likes Wellbutrin however she could not afford.  Patient has a history of mania or psychosis.  Medical history. Patient has history of thyroid disease, hypertension, osteoporosis, and back pain. She see physician at [facility].    Mental status examination. Patient is casually dressed and fairly groomed.  She is pleasant calm and cooperative.  She described her mood is okay and her affect is neutral.  She maintained good eye contact. She denies any active or passive suicidal thoughts or homicidal thoughts. Her attention and concentration is fair. Her speech is slow but linear and goal-directed. There no psychotic symptoms present. She denies any auditory or  visual hallucination. She's alert and oriented x3. Her insight judgment and impulse control is okay  Assessment Axis I Maj. depressive disorder Axis II deferred. Axis III hypertension, thyroid problems, osteoporosis, and back pain. Axis IV mild to moderate  Plan I will continue trazodone 100 mg at bedtime.  Patient will also see her pain doctor regularly.  I explained the risk and benefits of medication.  I recommend to call us if she any question or concern or if she feels worsening of the symptoms.  I will see her again in 3 months.

## 2011-10-17 ENCOUNTER — Encounter (HOSPITAL_COMMUNITY): Payer: Self-pay | Admitting: Psychiatry

## 2011-10-17 ENCOUNTER — Ambulatory Visit (INDEPENDENT_AMBULATORY_CARE_PROVIDER_SITE_OTHER): Payer: Self-pay | Admitting: Psychiatry

## 2011-10-17 DIAGNOSIS — F329 Major depressive disorder, single episode, unspecified: Secondary | ICD-10-CM

## 2011-10-17 DIAGNOSIS — F331 Major depressive disorder, recurrent, moderate: Secondary | ICD-10-CM

## 2011-10-17 MED ORDER — TRAZODONE HCL 100 MG PO TABS: 100.0000 mg | ORAL_TABLET | Freq: Every day | ORAL | Status: DC

## 2011-10-17 NOTE — Progress Notes (Signed)
Chief complaint Medication management followup.  History of present illness. Patient is 56 year old Caucasian female who came for her followup appointment.  She is seeing Dr. Terese Door for her pain management .  She is taking Motrin and Lidoderm patch which is helping her pain.  She is compliant with trazodone which is helping her sleep.  She still struggling to get her disability approved.  She's writing a letter to expedite the process.  Overall her depression remains stable.  She denies any agitation anger mood swing.  She denies any hallucination or paranoid thinking.  She denies any side effects of medication.  Current psychiatric medication. Trazodone 100 mg at bedtime.    Past psychiatric history Patient has been seeing in this office since 2008.  She had overdose on Xanax and Vicodin in 2008.  She did intensive outpatient program.  She denies any history of psychiatric inpatient treatment.  In the past she had tried Zoloft and Wellbutrin.  She likes Wellbutrin however she could not afford.  Patient has a history of mania or psychosis.  Medical history. Patient has history of thyroid disease, hypertension, osteoporosis, and back pain. She see physician at [facility].    Mental status examination. Patient is casually dressed and fairly groomed.  She is pleasant calm and cooperative.  She described her mood is okay and her affect is neutral.  She maintained good eye contact. She denies any active or passive suicidal thoughts or homicidal thoughts. Her attention and concentration is fair. Her speech is slow but linear and goal-directed. There no psychotic symptoms present. She denies any auditory or visual hallucination. She's alert and oriented x3. Her insight judgment and impulse control is okay  Assessment Axis I Maj. depressive disorder Axis II deferred. Axis III hypertension, thyroid problems, osteoporosis, and back pain. Axis IV mild to moderate  Plan I will continue trazodone 100 mg  at bedtime.  Patient will also see her pain doctor regularly.  I explained the risk and benefits of medication.  I recommend to call us if she any question or concern or if she feels worsening of the symptoms.  I will see her again in 3 months.

## 2011-12-11 DIAGNOSIS — R079 Chest pain, unspecified: Secondary | ICD-10-CM

## 2012-01-16 ENCOUNTER — Ambulatory Visit (HOSPITAL_COMMUNITY): Payer: Self-pay | Admitting: Psychiatry

## 2012-03-22 IMAGING — CR DG LUMBAR SPINE 2-3V
2 series · 2 of 2 positions shown · non-contrast
Comparison: Intraoperative C-arm spot films of 06/13/2010

CLINICAL DATA: Postop back pain

LUMBAR SPINE - 2-3 VIEW

[w l-spine a.p.]
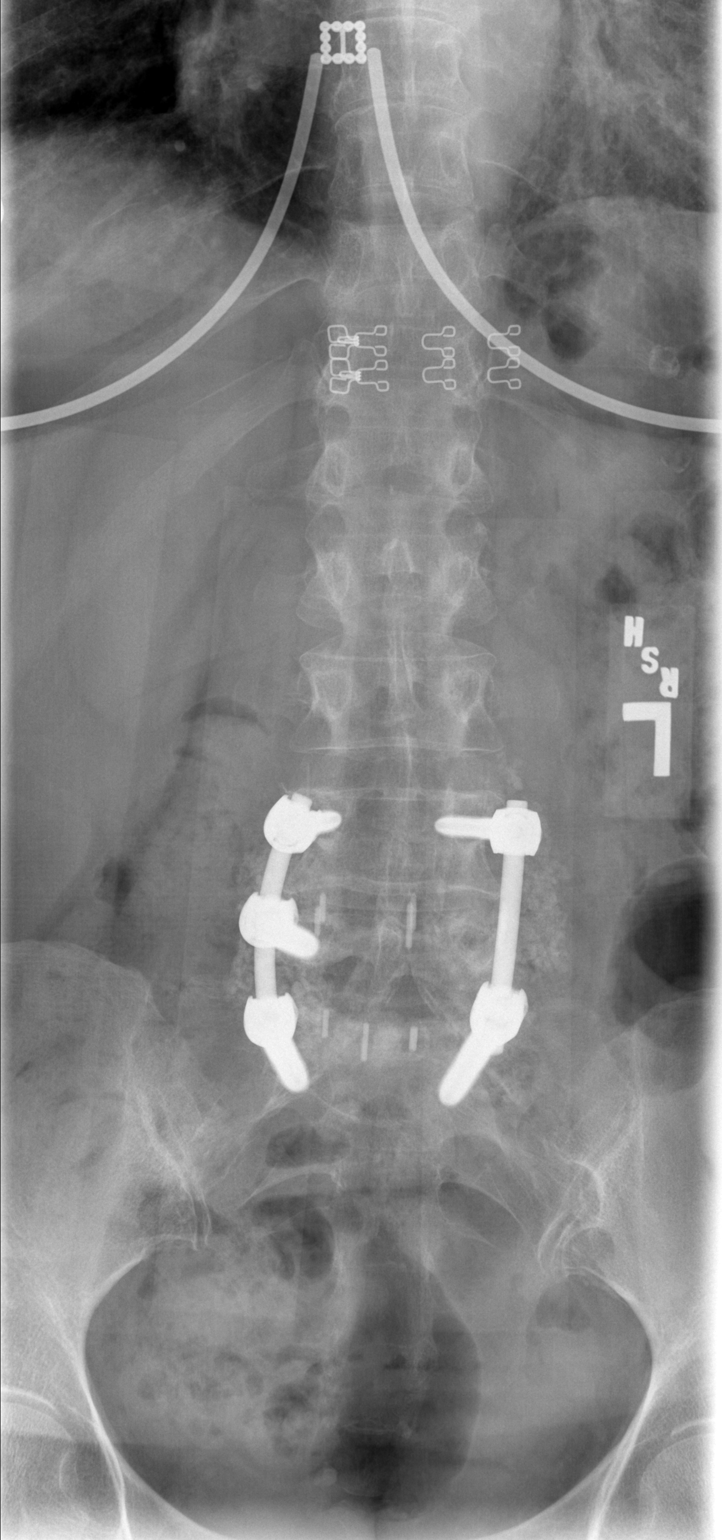

[w l-spine lat]
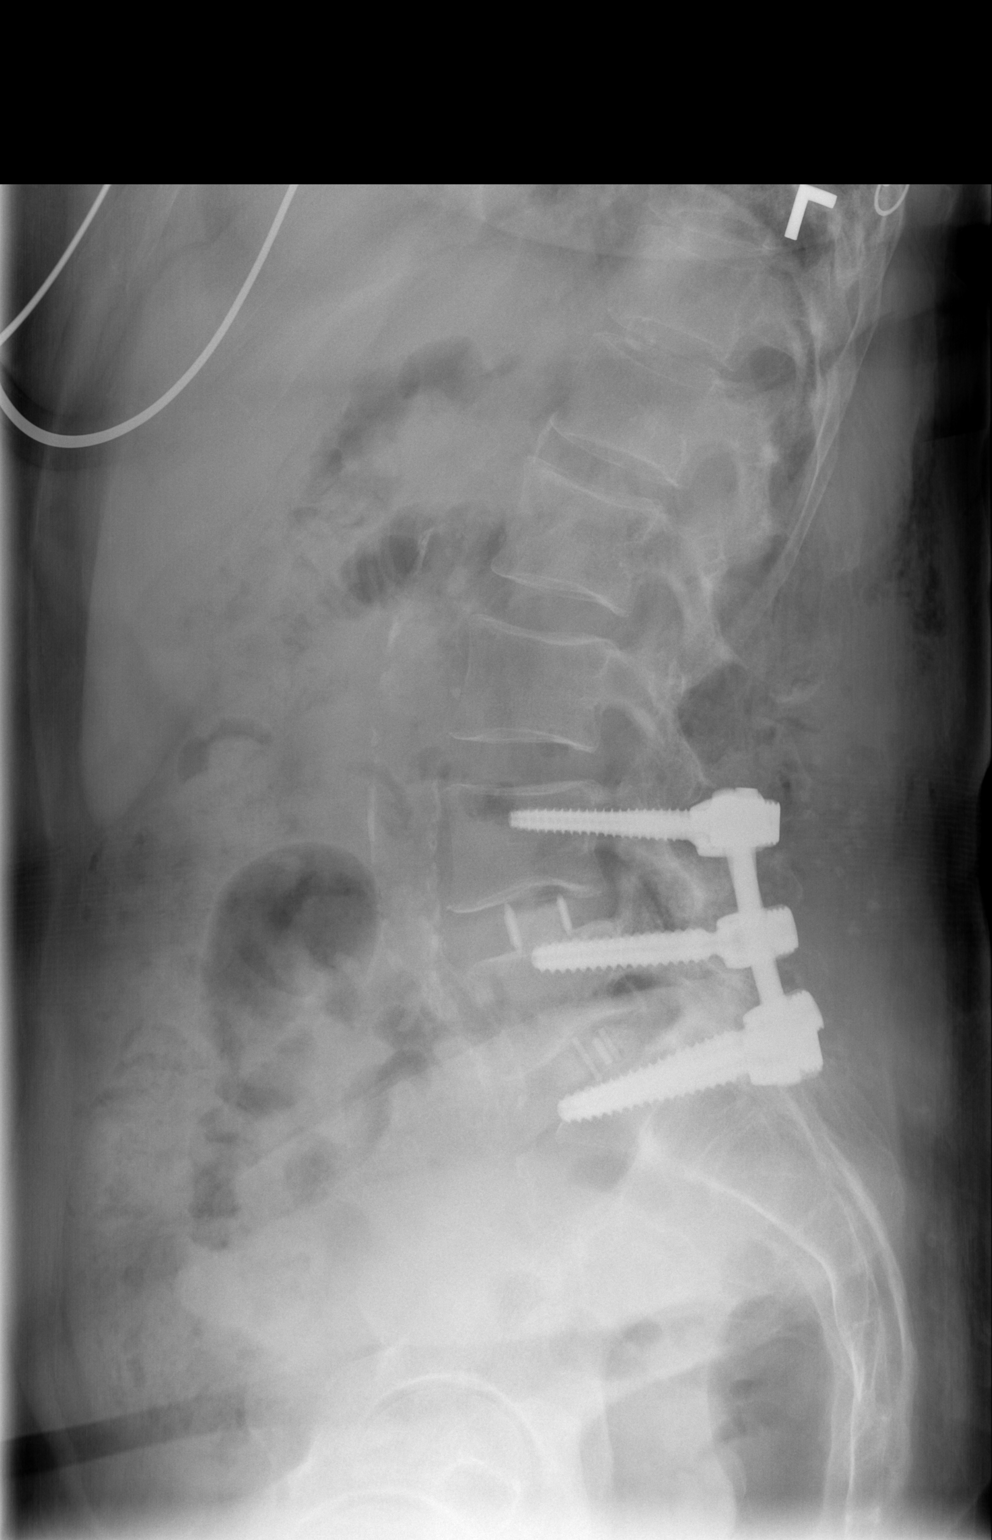

[2 of 2 positions shown; findings below may reference images not displayed]

FINDINGS: There is little change in fixation hardware at L4-5 and
L5-S1.  Interbody fusion plugs are unchanged in position and normal
alignment is maintained.
IMPRESSION: Stable appearance of posterior fusion at L4-5 and L5-S1.

## 2014-07-12 ENCOUNTER — Encounter (INDEPENDENT_AMBULATORY_CARE_PROVIDER_SITE_OTHER): Payer: Self-pay | Admitting: *Deleted

## 2014-08-03 ENCOUNTER — Encounter (INDEPENDENT_AMBULATORY_CARE_PROVIDER_SITE_OTHER): Payer: Self-pay | Admitting: Internal Medicine

## 2014-08-03 ENCOUNTER — Ambulatory Visit (INDEPENDENT_AMBULATORY_CARE_PROVIDER_SITE_OTHER): Payer: Medicare HMO | Admitting: Internal Medicine

## 2014-08-03 DIAGNOSIS — K5909 Other constipation: Secondary | ICD-10-CM

## 2014-08-03 DIAGNOSIS — Z800 Family history of malignant neoplasm of digestive organs: Secondary | ICD-10-CM

## 2014-08-03 DIAGNOSIS — E039 Hypothyroidism, unspecified: Secondary | ICD-10-CM | POA: Insufficient documentation

## 2014-08-03 DIAGNOSIS — I10 Essential (primary) hypertension: Secondary | ICD-10-CM | POA: Insufficient documentation

## 2014-08-03 NOTE — Progress Notes (Signed)
   Subjective:    Patient ID: Shannon Glover, female    DOB: 1956/03/15, 59 y.o.   MRN: [id number]  HPI  Referred to our office by Dr. Woody Seller for screening colonoscopy.  Last colonoscopy 3 yrs ago by Dr. Laural Golden. Family hx of colon cancer in a brother age 63. Her last colonoscopy was in 2011 by Dr. Laural Golden.  03/07/2010 Colonoscopy:  Few diverticula at sigmoid and sigmoid colon and descending colon, External hemorrhoids, No evidence of recurrent polyp.  Appetite is good. No weight loss. No abdominal pain. She has a BM once a week. She says she has had problems with constipation all her life. She has tried stool stimulant in the past.  No melena or BRRB. No change in her stool. She occasionally takes a BC powder.   06/23/2014 H and H 13.4 nad 40.2, Platelet ct 221., total bili 0.2, ALP 80, AST 15, ALT 6 Review of Systems  Past Medical History  Diagnosis Date  . HTN (hypertension)   . Thyroid disease   . Osteopetrosis   . Back pain     Past Surgical History  Procedure Laterality Date  . Back surgery    . Cholecystectomy      Allergies  Allergen Reactions  . Compazine     Lock jaw    Current Outpatient Prescriptions on File Prior to Visit  Medication Sig Dispense Refill  . [DISCONTINUED] buPROPion (WELLBUTRIN SR) 150 MG 12 hr tablet Take 1 tablet (150 mg total) by mouth daily. 90 tablet 0   No current facility-administered medications on file prior to visit.      Divorced. Three children in good health.     Objective:   Physical Exam Blood pressure 136/82, pulse 65, temperature 97.7 F (36.5 C), height 5' 2.5" (1.588 m), weight 131 lb 1.6 oz (59.467 kg). Alert and oriented. Skin warm and dry. Oral mucosa is moist.   . Sclera anicteric, conjunctivae is pink. Thyroid not enlarged. No cervical lymphadenopathy. Lungs clear. Heart regular rate and rhythm.  Abdomen is soft. Bowel sounds are positive. No hepatomegaly. No abdominal masses felt. No tenderness.  No edema to lower  extremities.          Assessment & Plan:   Surveillance colonoscopy. Family hx of colon cancer. he risks and benefits such as perforation, bleeding, and infection were reviewed with the patient and is agreeable. Patient will be put on a recall for December 2011. Constipation: Samples of Linzess given to patient.  She will call with a PR report in 2 weeks and let me know if the medication is helping.

## 2014-08-03 NOTE — Patient Instructions (Signed)
Samples of Linzess. Colonoscopy. The risks and benefits such as perforation, bleeding, and infection were reviewed with the patient and is agreeable.

## 2014-08-04 ENCOUNTER — Encounter (INDEPENDENT_AMBULATORY_CARE_PROVIDER_SITE_OTHER): Payer: Self-pay

## 2015-02-17 ENCOUNTER — Encounter (INDEPENDENT_AMBULATORY_CARE_PROVIDER_SITE_OTHER): Payer: Self-pay | Admitting: *Deleted

## 2015-02-28 ENCOUNTER — Ambulatory Visit (HOSPITAL_COMMUNITY): Payer: Medicare HMO | Attending: Anesthesiology

## 2015-02-28 DIAGNOSIS — R293 Abnormal posture: Secondary | ICD-10-CM | POA: Diagnosis present

## 2015-02-28 DIAGNOSIS — M898X1 Other specified disorders of bone, shoulder: Secondary | ICD-10-CM | POA: Insufficient documentation

## 2015-02-28 DIAGNOSIS — M47819 Spondylosis without myelopathy or radiculopathy, site unspecified: Secondary | ICD-10-CM

## 2015-02-28 DIAGNOSIS — M4040 Postural lordosis, site unspecified: Secondary | ICD-10-CM

## 2015-02-28 DIAGNOSIS — G8929 Other chronic pain: Secondary | ICD-10-CM | POA: Diagnosis present

## 2015-02-28 DIAGNOSIS — M4004 Postural kyphosis, thoracic region: Secondary | ICD-10-CM | POA: Diagnosis present

## 2015-02-28 DIAGNOSIS — M542 Cervicalgia: Secondary | ICD-10-CM | POA: Diagnosis not present

## 2015-02-28 NOTE — Patient Instructions (Signed)
Keeping Chin Tucked       Chin tucks into a pillow: Lying on back, tuck chin and press into pillow. Hold for 5 seconds, repeat 15 times 3 times daily.    Keep chin tucked and shoulders back when picking up objects.   Copyright  VHI. All rights reserved.      Lying with rolled or folded towel along the middle back, brings arms overhead, fingers clasped behind neck, allowed elbows to fall down gently and stretch toward floor. Hold for 3-5 minutes as tolerated. Be sure to support head with a pillow or two.

## 2015-03-01 ENCOUNTER — Ambulatory Visit (HOSPITAL_COMMUNITY): Payer: Medicare HMO | Admitting: Physical Therapy

## 2015-03-01 DIAGNOSIS — M898X1 Other specified disorders of bone, shoulder: Secondary | ICD-10-CM

## 2015-03-01 DIAGNOSIS — M542 Cervicalgia: Secondary | ICD-10-CM | POA: Diagnosis not present

## 2015-03-01 DIAGNOSIS — G8929 Other chronic pain: Secondary | ICD-10-CM

## 2015-03-01 DIAGNOSIS — R293 Abnormal posture: Secondary | ICD-10-CM

## 2015-03-01 NOTE — Therapy (Addendum)
[place], [place], [address] Phone: [phone]   Fax:  [phone]  Physical Therapy Treatment  Patient Details  Name: Shannon Glover MRN: [id number] Date of Birth: September 24, 1955 Referring Provider: Nicholaus Bloom  Encounter Date: 03/01/2015      PT End of Session - 03/01/15 0918    Visit Number 2   Number of Visits 8   Date for PT Re-Evaluation 03/30/15   Authorization Type medicare   Authorization - Visit Number 1   Authorization - Number of Visits 8   PT Start Time [phone]  Did not have evaluation on file so needed to check with primary therapist .  Tx started late.   PT Stop Time 0930   PT Time Calculation (min) 35 min   Activity Tolerance Patient tolerated treatment well      Past Medical History  Diagnosis Date  . HTN (hypertension)   . Thyroid disease   . Osteopetrosis   . Back pain     Past Surgical History  Procedure Laterality Date  . Back surgery    . Cholecystectomy      There were no vitals filed for this visit.  Visit Diagnosis:  Body posture problem  Chronic scapular pain      Subjective Assessment - 03/01/15 0855    Subjective Pt states that she was able to complete the exercises and feels somewhat better.  Evenings are worse    Currently in Pain? Yes   Pain Score 4    Pain Location Scapula   Pain Orientation Left   Pain Descriptors / Indicators Aching                  [facility] Adult PT Treatment/Exercise - 03/01/15 0001    Exercises   Exercises Neck;Shoulder   Neck Exercises: Theraband   Scapula Retraction 10 reps;Green   Shoulder Extension 10 reps;Green   Rows 10 reps;Green   Neck Exercises: Standing   Other Standing Exercises 3 way pectoral stretch x 3 each    Neck Exercises: Seated   Neck Retraction 10 reps   X to V 10 reps   W Back 10 reps   Shoulder Shrugs 5 reps   Shoulder Shrugs Limitations up back and relax    Other Seated Exercise scapular retraciton  x 10                 PT Education - 03/01/15 0913    Education provided Yes   Education Details new exercises   Person(s) Educated Patient   Methods Explanation;Verbal cues;Handout   Comprehension Verbalized understanding;Returned demonstration                    Plan - 03/01/15 0920    Clinical Impression Statement Pt is becoming more aware posture.  Completing exercises at home.  Therapist instructed pt in new stretching and strengthening exercises with verbal cuing given for proper technique.     PT Next Visit Plan Continue with postural awareness.  Begin I, Y's and T's next treatment    PT Home Exercise Plan given      [phone] CJ [phone] CJ   Problem List Patient Active Problem List   Diagnosis Date Noted  . Essential hypertension 08/03/2014  . Hypothyroidism 08/03/2014  . Depression 07/18/2011    Rayetta Humphrey, PT CLT [phone] 03/01/2015, 9:31 AM  [place], [place], [address] Phone: [phone]  Fax:  [phone]  Name: Shannon Glover MRN: [id number] Date of Birth: 1955-08-27  PHYSICAL THERAPY DISCHARGE SUMMARY  Visits from Start of Care: 2  Current functional level related to goals / functional outcomes: same   Remaining deficits: same   Education / Equipment: HEP  Plan: Patient agrees to discharge.  Patient goals were not met. Patient is being discharged due to not returning since the last visit.  ?????      Rayetta Humphrey, [place] CLT [phone]

## 2015-03-01 NOTE — Therapy (Addendum)
[facility] [address] [place], [place], [address] Phone: [phone]   Fax:  [phone]  Physical Therapy Evaluation  Patient Details  Name: Shannon Glover MRN: [id number] Date of Birth: Aug 06, 1955 Referring Provider: Nicholaus Bloom  Encounter Date: 02/28/2015      PT End of Session - 03/01/15 0939    Visit Number 1   Number of Visits 18   Date for PT Re-Evaluation 03/31/15   Authorization Type Medicare   Authorization Time Period 02/28/15-05/01/2015   Authorization - Visit Number 1   PT Start Time 0938   PT Stop Time 1024   PT Time Calculation (min) 46 min   Activity Tolerance Patient tolerated treatment well  decrease in pain after treatment   Behavior During Therapy [facility] for tasks assessed/performed      Past Medical History  Diagnosis Date  . HTN (hypertension)   . Thyroid disease   . Osteopetrosis   . Back pain     Past Surgical History  Procedure Laterality Date  . Back surgery    . Cholecystectomy      There were no vitals filed for this visit.  Visit Diagnosis:  Cervicalgia - Plan: PT plan of care cert/re-cert  Chronic scapular pain - Plan: PT plan of care cert/re-cert  Postural kyphosis of thoracic region - Plan: PT plan of care cert/re-cert  Postural lordosis - Plan: PT plan of care cert/re-cert  Arthropathy of facet joint - Plan: PT plan of care cert/re-cert      Subjective Assessment - 03/01/15 0934    Subjective Pt is a 59 yo white female who complains of Left sided scapular pain originating at the medial scapular border and extending up to the neck. Insidious onset 3 months ago. Problem has been getting progressively worse, as it seems to be more easily aggravated.    Pertinent History History of LBP/ (L5/S1 surgery failed fusion 2010, also fused L3/L4 2012); continues to be in low level pain, but takes meds that keep it controlled.    Limitations Standing   How long can you stand comfortably? 30  minutes,   Currently in Pain? Yes   Pain Score 4    Pain Location Scapula   Pain Orientation Left   Pain Descriptors / Indicators Aching;Throbbing   Aggravating Factors  sitting in a recliner, Left head turns, washing hair.    Pain Relieving Factors heat, lying down (resolves within 1 hour)             [facility] PT Assessment - 03/01/15 0001    Assessment   Medical Diagnosis L scapular pain    Referring Provider Nicholaus Bloom   Onset Date/Surgical Date 11/29/14   Hand Dominance Right   Next MD Visit March 5ish   Prior Therapy none   Precautions   Precautions None   Balance Screen   Has the patient fallen in the past 6 months No   Has the patient had a decrease in activity level because of a fear of falling?  Yes   Is the patient reluctant to leave their home because of a fear of falling?  Yes   Prior Function   Level of Independence Independent   Vocation On disability  since back surgery    Vocation Requirements able to perform all housework without restriction   Observation/Other Assessments   Focus on Therapeutic Outcomes (FOTO)  40 (60% impaired)       Palpation: L infraspinatus is very painful with plapation near  the scapular border  (no pain with resisted isometrics).  Attachment of L levator scapula with mild soft tissue effusion noted.     Light Touch Sensation/strength:  BUE sensation and all seated BUE MMT WNL, symetrical and intact.   Prone testing MMT:  L shoulder ER: 4/5 pain free L shoulder IR: 3+/5 pain free L posterior deltoid: 3/5 pain free (limited due to scapular elevation posturing) L Middle Trap: 4/5, pain free  ROM: Cervical Flexion: 55*  Cervical extension: 50* R- Cervical Side-bending: 27* L-Cervical Side-bending: 25* R- Cervical Rot: 73* L- Cervical Rot: 68*  Special Tests: Spurlings 1: negative Suprlings 2: (+) for facet pain only on Left; likely a closing deficit at C5/C6  Joint Mobility:  Spring testing of C7, C6, C5, C4  performed. C5 and C4 are very painful, and  limited by muscle guarding.        [facility] Adult PT Treatment/Exercise - 03/01/15 0001    Exercises   Exercises Neck;Shoulder   Neck Exercises: Theraband   Scapula Retraction --  Begin next visit   Shoulder Extension --  Begin next visit   Rows --  Begin next visit   Shoulder External Rotation --  Begin next visit, sidelying c dumbbell   Neck Exercises: Supine   Neck Retraction 15 reps;5 secs  Chin tucks into 2 pillows   Shoulder Exercises: ROM/Strengthening   X to V Arms --  Begin next visit   Other ROM/Strengthening Exercises Repeated thoracic extension over chair back  Begin next visit   Shoulder Exercises: Stretch   Star Gazer Stretch 1 rep;Other (comment);Limitations  Longitudinal towel roll T3-T8, 3 minutes                PT Education - 03/01/15 0939    Education provided Yes   Education Details Explained how postural changes in trunk and neck with  continue to exacerabte pain if not resolved.    Person(s) Educated Patient   Methods Explanation   Comprehension Verbalized understanding          PT Short Term Goals - 03/01/15 0951    PT SHORT TERM GOAL #1   Title Pt will demonstrate independence in beginning home exercise program by twos weeks after commencement of therapy, to affirm self-efficacy in work at home to making progress toward goals.   PT SHORT TERM GOAL #2   Title After 2 weeks, pt will describe in detail 3 ways to manage exacerbation of symptoms at home to demonstrate greater self-efficacy in self-management of wellness and function.    PT SHORT TERM GOAL #3   Title After 3 weeks pt will be able to perform all showering, reading, and prolonged sitting for up to 90 minutes without exacerbation of symptoms and pain less than 3/10.            PT Long Term Goals - 03/01/15 [id number]    PT LONG TERM GOAL #1   Title Pt will demonstrate independence in advanced home exercise program by 1 week prior to  discharge, to further self-efficacy in continuation of progress toward goals after discharge from therapy.    PT LONG TERM GOAL #2   Title After 6 weeks pt will demonstrate improved mobility of thoracic spine into extention and all cervical ROM WNL and symetrical without pain exacerbation.    PT LONG TERM GOAL #3   Title After 6 weeks pt will be able to perform all showering, reading, and prolonged sitting for up to 120 minutes without exacerbation  of symptoms and painfree.                Plan - 03/01/15 0941    Clinical Impression Statement Pt is a 59 yo white female with a history of Lumbar surgery x2, chronic pain, and new onset of L sided mechanical neck pain. Pt demonstrating left sided facet closing deficit/pain between C5/C6 with referred pain to the L medial scapular border. Strength assessment and range of motion testing reveals only mild deficits. 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 Palpable triggerpoints identified at the L infraspinatus, and ild soft tissue effusion near the scapular point of attachment of the L levator scapula . Exam does not identify any involvement of the cervical spinal nerves, however Spurlings Testing 2 on the L is positive for pain with left sided closing.     Pt will benefit from skilled therapeutic intervention in order to improve on the following deficits Decreased range of motion;Pain;Improper body mechanics;Postural dysfunction;Decreased activity tolerance;Decreased knowledge of precautions;Impaired UE functional use   Rehab Potential Good   PT Frequency 2x / week   PT Duration 6 weeks   PT Treatment/Interventions ADLs/Self Care Home Management;Moist Heat;Therapeutic exercise;Therapeutic activities;Manual techniques;Patient/family education   PT Next Visit  Plan Review HEP, add in some more activites for Scapular strengthening at home.    PT Home Exercise Plan Startgazer stretch on towel roll, cerviocal retraction into 2 pillows 15x5 seconds.    Consulted and Agree with Plan of Care Patient     PT G-Codes:  **NOT FOR INPATIENT CLASS** Functional Limitation: Changing & Maintaining Body Position Mobility: Changing & Maintaining Body Position Current Status [id number]): At least 40 percent but less than 60 percent impaired, limited or restricted Mobility: Changing & Maintaining Body Position Goal Status [id number]): At least 40 percent but less than 60 percent impaired, limited or restricted    Problem List Patient Active Problem List   Diagnosis Date Noted  . Essential hypertension 08/03/2014  . Hypothyroidism 08/03/2014  . Depression 07/18/2011    Buccola,Allan C 03/01/2015, 10:03 AM  10:03 AM  Etta Grandchild, PT, DPT [place] License # [id number]  *Addended on 2015-04-12 to add G-codes based on findings from evaluation in 03/01/15. 8:50 AM 2015-04-12 Etta Grandchild, PT, DPT [place] License # [id number]            [facility] [address] [place], [place], [address] Phone: [phone]   Fax:  [phone]  Name: Shannon Glover MRN: [id number] Date of Birth: Oct 29, 1955

## 2015-03-01 NOTE — Patient Instructions (Signed)
Flexibility: Neck Retraction    Pull head straight back, keeping eyes and jaw level. Repeat _10___ times per set. Do __1__ sets per session. Do 2____ sessions per day.  http://orth.exer.us/344   Copyright  VHI. All rights reserved.  Scapular Retraction (Standing)    With arms at sides, pinch shoulder blades together. Repeat __10__ times per set. Do __`__ sets per session. Do _2___ sessions per day.  http://orth.exer.us/944   Copyright  VHI. All rights reserved.  Scapular Retraction: Elbow Flexion (Standing)    With elbows bent to 90, pinch shoulder blades together and rotate arms out, keeping elbows bent. Repeat __10__ times per set. Do _1___ sets per session. Do __2__ sessions per day.  http://orth.exer.us/948   Copyright  VHI. All rights reserved.  Progressive Resisted: External Rotation (Side-Lying)    Holding __2__ pound weight, towel under arm, raise right forearm toward ceiling. Keep elbow bent and at side. Repeat __10__ times per set. Do ___1_ sets per session. Do _2___ sessions per day.  http://orth.exer.us/878   Copyright  VHI. All rights reserved.

## 2015-03-21 ENCOUNTER — Telehealth (HOSPITAL_COMMUNITY): Payer: Self-pay

## 2015-03-21 NOTE — Telephone Encounter (Signed)
03/21/15 pt called to cx all future apptments she stated that her insurance said she would have a 40.00 copay per visit and she could not afford that rigth now. NF

## 2015-03-22 ENCOUNTER — Ambulatory Visit (HOSPITAL_COMMUNITY): Payer: Medicare HMO

## 2015-03-24 ENCOUNTER — Ambulatory Visit (HOSPITAL_COMMUNITY): Payer: Medicare HMO | Admitting: Physical Therapy

## 2015-03-28 ENCOUNTER — Ambulatory Visit (HOSPITAL_COMMUNITY): Payer: Medicare HMO

## 2015-03-30 ENCOUNTER — Encounter (HOSPITAL_COMMUNITY): Payer: Worker's Compensation | Admitting: Physical Therapy

## 2015-04-04 ENCOUNTER — Encounter (HOSPITAL_COMMUNITY): Payer: Self-pay

## 2015-04-06 ENCOUNTER — Encounter (HOSPITAL_COMMUNITY): Payer: Self-pay

## 2015-04-11 ENCOUNTER — Encounter (HOSPITAL_COMMUNITY): Payer: Self-pay

## 2015-04-13 ENCOUNTER — Encounter (HOSPITAL_COMMUNITY): Payer: Self-pay | Admitting: Physical Therapy

## 2015-04-18 ENCOUNTER — Encounter (HOSPITAL_COMMUNITY): Payer: Self-pay | Admitting: Physical Therapy

## 2015-04-20 ENCOUNTER — Encounter (HOSPITAL_COMMUNITY): Payer: Self-pay | Admitting: Physical Therapy

## 2015-04-25 ENCOUNTER — Encounter (HOSPITAL_COMMUNITY): Payer: Self-pay

## 2015-04-27 ENCOUNTER — Encounter (HOSPITAL_COMMUNITY): Payer: Self-pay | Admitting: Physical Therapy

## 2015-07-12 ENCOUNTER — Other Ambulatory Visit (INDEPENDENT_AMBULATORY_CARE_PROVIDER_SITE_OTHER): Payer: Self-pay | Admitting: *Deleted

## 2015-07-12 DIAGNOSIS — Z800 Family history of malignant neoplasm of digestive organs: Secondary | ICD-10-CM

## 2015-09-18 ENCOUNTER — Other Ambulatory Visit (INDEPENDENT_AMBULATORY_CARE_PROVIDER_SITE_OTHER): Payer: Self-pay | Admitting: *Deleted

## 2015-09-18 ENCOUNTER — Telehealth (INDEPENDENT_AMBULATORY_CARE_PROVIDER_SITE_OTHER): Payer: Self-pay | Admitting: *Deleted

## 2015-09-18 ENCOUNTER — Encounter (INDEPENDENT_AMBULATORY_CARE_PROVIDER_SITE_OTHER): Payer: Self-pay | Admitting: *Deleted

## 2015-09-18 NOTE — Telephone Encounter (Signed)
Patient needs trilyte 

## 2015-09-18 NOTE — Telephone Encounter (Signed)
Referring MD/PCP: vyas   Procedure: tcs  Reason/Indication:  fam hx colon ca  Has patient had this procedure before?  Yes, 2011 -- epic  If so, when, by whom and where?    Is there a family history of colon cancer?  Yes, brother  Who?  What age when diagnosed?    Is patient diabetic?   no      Does patient have prosthetic heart valve or mechanical valve?  no  Do you have a pacemaker?  no  Has patient ever had endocarditis? no  Has patient had joint replacement within last 12 months?  no  Does patient tend to be constipated or take laxatives? yes  Does patient have a history of alcohol/drug use?  no  Is patient on Coumadin, Plavix and/or Aspirin? no  Medications: see epic  Allergies: see epic  Medication Adjustment:   Procedure date & time: 10/18/15 at 930

## 2015-09-20 NOTE — Telephone Encounter (Signed)
agree

## 2015-09-21 MED ORDER — PEG 3350-KCL-NA BICARB-NACL 420 G PO SOLR: 4000.0000 mL | Freq: Once | ORAL | Status: DC

## 2015-10-11 ENCOUNTER — Other Ambulatory Visit (INDEPENDENT_AMBULATORY_CARE_PROVIDER_SITE_OTHER): Payer: Self-pay | Admitting: Internal Medicine

## 2015-10-18 ENCOUNTER — Encounter (HOSPITAL_COMMUNITY)
Admission: RE | Disposition: A | Discharge status: Home / Self Care | Payer: Self-pay | Source: Ambulatory Visit | Attending: Internal Medicine

## 2015-10-18 ENCOUNTER — Ambulatory Visit (HOSPITAL_COMMUNITY)
Admission: RE | Admit: 2015-10-18 | Discharge: 2015-10-18 | Disposition: A | Discharge status: Home / Self Care | Payer: Medicare HMO | Source: Ambulatory Visit | Attending: Internal Medicine | Admitting: Internal Medicine

## 2015-10-18 ENCOUNTER — Encounter (HOSPITAL_COMMUNITY): Payer: Self-pay | Admitting: *Deleted

## 2015-10-18 DIAGNOSIS — I10 Essential (primary) hypertension: Secondary | ICD-10-CM | POA: Diagnosis not present

## 2015-10-18 DIAGNOSIS — D120 Benign neoplasm of cecum: Secondary | ICD-10-CM | POA: Diagnosis not present

## 2015-10-18 DIAGNOSIS — Z1211 Encounter for screening for malignant neoplasm of colon: Secondary | ICD-10-CM | POA: Insufficient documentation

## 2015-10-18 DIAGNOSIS — D125 Benign neoplasm of sigmoid colon: Secondary | ICD-10-CM | POA: Diagnosis not present

## 2015-10-18 DIAGNOSIS — E039 Hypothyroidism, unspecified: Secondary | ICD-10-CM | POA: Insufficient documentation

## 2015-10-18 DIAGNOSIS — Z79899 Other long term (current) drug therapy: Secondary | ICD-10-CM | POA: Diagnosis not present

## 2015-10-18 DIAGNOSIS — D123 Benign neoplasm of transverse colon: Secondary | ICD-10-CM | POA: Diagnosis not present

## 2015-10-18 DIAGNOSIS — K5730 Diverticulosis of large intestine without perforation or abscess without bleeding: Secondary | ICD-10-CM | POA: Insufficient documentation

## 2015-10-18 DIAGNOSIS — F17210 Nicotine dependence, cigarettes, uncomplicated: Secondary | ICD-10-CM | POA: Diagnosis not present

## 2015-10-18 DIAGNOSIS — K6289 Other specified diseases of anus and rectum: Secondary | ICD-10-CM | POA: Diagnosis not present

## 2015-10-18 DIAGNOSIS — Z800 Family history of malignant neoplasm of digestive organs: Secondary | ICD-10-CM | POA: Insufficient documentation

## 2015-10-18 DIAGNOSIS — Z86010 Personal history of colonic polyps: Secondary | ICD-10-CM | POA: Diagnosis not present

## 2015-10-18 HISTORY — DX: Hypothyroidism, unspecified: E03.9

## 2015-10-18 HISTORY — PX: COLONOSCOPY: SHX5424

## 2015-10-18 HISTORY — DX: Constipation, unspecified: K59.00

## 2015-10-18 SURGERY — COLONOSCOPY
Anesthesia: Moderate Sedation

## 2015-10-18 MED ORDER — MEPERIDINE HCL 50 MG/ML IJ SOLN
INTRAMUSCULAR | Status: AC
  Filled 2015-10-18: qty 1

## 2015-10-18 MED ORDER — STERILE WATER FOR IRRIGATION IR SOLN
Status: DC | PRN
  Administered 2015-10-18: 2.5 mL

## 2015-10-18 MED ORDER — SODIUM CHLORIDE 0.9 % IV SOLN
INTRAVENOUS | Status: DC
  Administered 2015-10-18: 09:00:00 via INTRAVENOUS

## 2015-10-18 MED ORDER — MEPERIDINE HCL 50 MG/ML IJ SOLN
INTRAMUSCULAR | Status: DC | PRN
  Administered 2015-10-18 (×2): 25 mg via INTRAVENOUS

## 2015-10-18 MED ORDER — MIDAZOLAM HCL 5 MG/5ML IJ SOLN
INTRAMUSCULAR | Status: AC
  Filled 2015-10-18: qty 10

## 2015-10-18 MED ORDER — MIDAZOLAM HCL 5 MG/5ML IJ SOLN
INTRAMUSCULAR | Status: DC | PRN
  Administered 2015-10-18 (×2): 2 mg via INTRAVENOUS
  Administered 2015-10-18 (×2): 1 mg via INTRAVENOUS

## 2015-10-18 NOTE — Op Note (Signed)
[facility] Patient Name: Shannon Glover Procedure Date: 10/18/2015 9:32 AM MRN: [id number] Date of Birth: May 16, 1955 Attending MD: Hildred Laser , MD CSN: [id number] Age: 60 Admit Type: Outpatient Procedure:                Colonoscopy Indications:              High risk colon cancer surveillance: Personal                            history of colonic polyps, Family history of colon                            cancer in a first-degree relative Providers:                Hildred Laser, MD, Lurline Del, RN, Purcell Nails.                            Tina Griffiths, Technician Referring MD:             Glenda Chroman, MD Medicines:                Meperidine 50 mg IV, Midazolam 6 mg IV Complications:            No immediate complications. Estimated Blood Loss:     Estimated blood loss was minimal. Procedure:                Pre-Anesthesia Assessment:                           - Prior to the procedure, a History and Physical                            was performed, and patient medications and                            allergies were reviewed. The patient's tolerance of                            previous anesthesia was also reviewed. The risks                            and benefits of the procedure and the sedation                            options and risks were discussed with the patient.                            All questions were answered, and informed consent                            was obtained. Prior Anticoagulants: The patient                            last took aspirin 1 day prior to the procedure. ASA  Grade Assessment: II - A patient with mild systemic                            disease. After reviewing the risks and benefits,                            the patient was deemed in satisfactory condition to                            undergo the procedure.                           After obtaining informed consent, the colonoscope                            was  passed under direct vision. Throughout the                            procedure, the patient's blood pressure, pulse, and                            oxygen saturations were monitored continuously. The                            [id number]) was introduced through the                            anus and advanced to the the cecum, identified by                            appendiceal orifice and ileocecal valve. The                            colonoscopy was performed without difficulty. The                            patient tolerated the procedure well. The quality                            of the bowel preparation was adequate. The                            ileocecal valve, appendiceal orifice, and rectum                            were photographed. Anatomical landmarks were                            photographed. Scope In: 9:45:35 AM Scope Out: 10:15:27 AM Scope Withdrawal Time: 0 hours 20 minutes 17 seconds  Total Procedure Duration: 0 hours 29 minutes 52 seconds  Findings:      A 5 mm polyp was found in the proximal transverse colon cecum. The polyp       was sessile. The polyp was removed with a  cold snare. Resection and       retrieval were complete. The pathology specimen was placed into Bottle       Number 1.      A 6 mm polyp was found in the sigmoid colon. The polyp was sessile. The       polyp was removed with a hot snare. Resection and retrieval were       complete. The pathology specimen was placed into Bottle Number 2.      A few medium-mouthed diverticula were found in the sigmoid colon.      Anal papilla(e) were hypertrophied. Impression:               - One 5 mm polyp in the proximal transverse colon                            in the cecum, removed with a cold snare. Resected                            and retrieved.                           - One 6 mm polyp in the sigmoid colon, removed with                            a hot snare. Resected and retrieved.                            - Diverticulosis in the sigmoid colon.                           - Anal papilla(e) were hypertrophied. Moderate Sedation:      Moderate (conscious) sedation was administered by the endoscopy nurse       and supervised by the endoscopist. The following parameters were       monitored: oxygen saturation, heart rate, blood pressure, CO2       capnography and response to care. Total physician intraservice time was       35 minutes. Recommendation:           - Patient has a contact number available for                            emergencies. The signs and symptoms of potential                            delayed complications were discussed with the                            patient. Return to normal activities tomorrow.                            Written discharge instructions were provided to the                            patient.                           -  High fiber diet today.                           - Resume aspirin at prior dose in 5 days.                           - Await pathology results.                           - Repeat colonoscopy in 5 years for surveillance. Procedure Code(s):        --- Professional ---                           [phone], Colonoscopy, flexible; with removal of                            tumor(s), polyp(s), or other lesion(s) by snare                            technique                           99152, Moderate sedation services provided by the                            same physician or other qualified health care                            professional performing the diagnostic or                            therapeutic service that the sedation supports,                            requiring the presence of an independent trained                            observer to assist in the monitoring of the                            patient's level of consciousness and physiological                            status; initial 15 minutes of  intraservice time,                            patient age 22 years or older                           [phone], Moderate sedation services; each additional                            15 minutes intraservice time Diagnosis Code(s):        --- Professional ---  Z86.010, Personal history of colonic polyps                           D12.3, Benign neoplasm of transverse colon (hepatic                            flexure or splenic flexure)                           D12.0, Benign neoplasm of cecum                           D12.5, Benign neoplasm of sigmoid colon                           K62.89, Other specified diseases of anus and rectum                           Z80.0, Family history of malignant neoplasm of                            digestive organs                           K57.30, Diverticulosis of large intestine without                            perforation or abscess without bleeding CPT copyright 2016 American Medical Association. All rights reserved. The codes documented in this report are preliminary and upon coder review may  be revised to meet current compliance requirements. Hildred Laser, MD Hildred Laser, MD 10/18/2015 10:23:31 AM This report has been signed electronically. Number of Addenda: 0

## 2015-10-18 NOTE — H&P (Signed)
Shannon Glover is an 60 y.o. female.   Chief Complaint: Patient is here for colonoscopy HPI: Patient is 60 year old Caucasian female was history of colonic adenoma and family history of CRC is here for surveillance colonoscopy. She denies abdominal pain change in bowel or was or rectal bleeding. She had adenomas in her first exam but none on her last exam of December 2011. Family history significant for CRC and brother who was 7 at the time of diagnosis. He required adjuvant chemotherapy. He remains in remission at age 57.  Past Medical History:  Diagnosis Date  . Back pain   . Constipation   . HTN (hypertension)   . Hypothyroidism   . Osteopetrosis   . Thyroid disease     Past Surgical History:  Procedure Laterality Date  . ABDOMINAL HYSTERECTOMY    . BACK SURGERY    . CHOLECYSTECTOMY      Family History  Problem Relation Age of Onset  . Depression Mother   . Colon cancer Brother    Social History:  reports that she has been smoking Cigarettes.  She has a 20.00 pack-year smoking history. She has never used smokeless tobacco. She reports that she does not drink alcohol or use drugs.  Allergies:  Allergies  Allergen Reactions  . Compazine     Lock jaw    Medications Prior to Admission  Medication Sig Dispense Refill  . Aspirin-Salicylamide-Caffeine (BC HEADACHE POWDER PO) Take by mouth.    . Gabapentin, Once-Daily, 600 MG TABS Take 600 mg by mouth at bedtime.    Marland Kitchen levothyroxine (SYNTHROID, LEVOTHROID) 100 MCG tablet Take 100 mcg by mouth daily before breakfast.    . tapentadol (NUCYNTA) 50 MG TABS tablet Take 50 mg by mouth 2 (two) times daily before a meal.      No results found for this or any previous visit (from the past 48 hour(s)). No results found.  ROS  Blood pressure (!) 151/82, pulse 77, temperature 98.7 F (37.1 C), temperature source Oral, resp. rate 18, height 5\' 2"  (1.575 m), weight 128 lb (58.1 kg), SpO2 99 %. Physical Exam  Constitutional: She appears  well-developed and well-nourished.  HENT:  Mouth/Throat: Oropharynx is clear and moist.  Eyes: Conjunctivae are normal. No scleral icterus.  Neck: No thyromegaly present.  Cardiovascular: Normal rate, regular rhythm and normal heart sounds.   No murmur heard. Respiratory: Effort normal and breath sounds normal.  GI: Soft. She exhibits no distension and no mass. There is no tenderness.  Musculoskeletal: She exhibits no edema.  Lymphadenopathy:    She has no cervical adenopathy.  Neurological: She is alert.  Skin: Skin is warm and dry.     Assessment/Plan History of colonic adenomas and family history of CRC in first-degree relative. Surveillance colonoscopy.  Hildred Laser, MD 10/18/2015, 9:34 AM

## 2015-10-18 NOTE — Discharge Instructions (Signed)
No aspirin or NSAIDs for 5 days. Resume other medications and high fiber diet. No driving for 24 hours. Physician will call with biopsy results.       Colonoscopy, Care After   These instructions give you information on caring for yourself after your procedure. Your doctor may also give you more specific instructions. Call your doctor if you have any problems or questions after your procedure. HOME CARE  Do not drive for 24 hours.  Do not sign important papers or use machinery for 24 hours.  You may shower.  You may go back to your usual activities, but go slower for the first 24 hours.  Take rest breaks often during the first 24 hours.  Walk around or use warm packs on your belly (abdomen) if you have belly cramping or gas.  Drink enough fluids to keep your pee (urine) clear or pale yellow.  Resume your normal diet. Avoid heavy or fried foods.  Avoid drinking alcohol for 24 hours or as told by your doctor.  Only take medicines as told by your doctor. If a tissue sample (biopsy) was taken during the procedure:   Do not take aspirin or blood thinners for 7 days, or as told by your doctor.  Do not drink alcohol for 7 days, or as told by your doctor.  Eat soft foods for the first 24 hours. GET HELP IF: You still have a small amount of blood in your poop (stool) 2-3 days after the procedure. GET HELP RIGHT AWAY IF:  You have more than a small amount of blood in your poop.  You see clumps of tissue (blood clots) in your poop.  Your belly is puffy (swollen).  You feel sick to your stomach (nauseous) or throw up (vomit).  You have a fever.  You have belly pain that gets worse and medicine does not help. MAKE SURE YOU:  Understand these instructions.  Will watch your condition.  Will get help right away if you are not doing well or get worse.   This information is not intended to replace advice given to you by your health care provider. Make sure you discuss  any questions you have with your health care provider.   Document Released: 04/06/2010 Document Revised: 03/09/2013 Document Reviewed: 11/09/2012 Elsevier Interactive Patient Education [employer].    Diverticulosis Diverticulosis is the condition that develops when small pouches (diverticula) form in the wall of your colon. Your colon, or large intestine, is where water is absorbed and stool is formed. The pouches form when the inside layer of your colon pushes through weak spots in the outer layers of your colon. CAUSES  No one knows exactly what causes diverticulosis. RISK FACTORS  Being older than 35. Your risk for this condition increases with age. Diverticulosis is rare in people younger than 40 years. By age 16, almost everyone has it.  Eating a low-fiber diet.  Being frequently constipated.  Being overweight.  Not getting enough exercise.  Smoking.  Taking over-the-counter pain medicines, like aspirin and ibuprofen. SYMPTOMS  Most people with diverticulosis do not have symptoms. DIAGNOSIS  Because diverticulosis often has no symptoms, health care providers often discover the condition during an exam for other colon problems. In many cases, a health care provider will diagnose diverticulosis while using a flexible scope to examine the colon (colonoscopy). TREATMENT  If you have never developed an infection related to diverticulosis, you may not need treatment. If you have had an infection before, treatment may  include:  Eating more fruits, vegetables, and grains.  Taking a fiber supplement.  Taking a live bacteria supplement (probiotic).  Taking medicine to relax your colon. HOME CARE INSTRUCTIONS   Drink at least 6-8 glasses of water each day to prevent constipation.  Try not to strain when you have a bowel movement.  Keep all follow-up appointments. If you have had an infection before:  Increase the fiber in your diet as directed by your health care  provider or dietitian.  Take a dietary fiber supplement if your health care provider approves.  Only take medicines as directed by your health care provider. SEEK MEDICAL CARE IF:   You have abdominal pain.  You have bloating.  You have cramps.  You have not gone to the bathroom in 3 days. SEEK IMMEDIATE MEDICAL CARE IF:   Your pain gets worse.  Yourbloating becomes very bad.  You have a fever or chills, and your symptoms suddenly get worse.  You begin vomiting.  You have bowel movements that are bloody or black. MAKE SURE YOU:  Understand these instructions.  Will watch your condition.  Will get help right away if you are not doing well or get worse.   This information is not intended to replace advice given to you by your health care provider. Make sure you discuss any questions you have with your health care provider.   Document Released: 11/30/2003 Document Revised: 03/09/2013 Document Reviewed: 01/27/2013 Elsevier Interactive Patient Education 2016 Elsevier Inc.   High-Fiber Diet Fiber, also called dietary fiber, is a type of carbohydrate found in fruits, vegetables, whole grains, and beans. A high-fiber diet can have many health benefits. Your health care provider may recommend a high-fiber diet to help:  Prevent constipation. Fiber can make your bowel movements more regular.  Lower your cholesterol.  Relieve hemorrhoids, uncomplicated diverticulosis, or irritable bowel syndrome.  Prevent overeating as part of a weight-loss plan.  Prevent heart disease, type 2 diabetes, and certain cancers. WHAT IS MY PLAN? The recommended daily intake of fiber includes:  38 grams for men under age 69.  26 grams for men over age 72.  45 grams for women under age 92.  48 grams for women over age 75. You can get the recommended daily intake of dietary fiber by eating a variety of fruits, vegetables, grains, and beans. Your health care provider may also recommend a  fiber supplement if it is not possible to get enough fiber through your diet. WHAT DO I NEED TO KNOW ABOUT A HIGH-FIBER DIET?  Fiber supplements have not been widely studied for their effectiveness, so it is better to get fiber through food sources.  Always check the fiber content on thenutrition facts label of any prepackaged food. Look for foods that contain at least 5 grams of fiber per serving.  Ask your dietitian if you have questions about specific foods that are related to your condition, especially if those foods are not listed in the following section.  Increase your daily fiber consumption gradually. Increasing your intake of dietary fiber too quickly may cause bloating, cramping, or gas.  Drink plenty of water. Water helps you to digest fiber. WHAT FOODS CAN I EAT? Grains Whole-grain breads. Multigrain cereal. Oats and oatmeal. Brown rice. Barley. Bulgur wheat. Palmer. Bran muffins. Popcorn. Rye wafer crackers. Vegetables Sweet potatoes. Spinach. Kale. Artichokes. Cabbage. Broccoli. Green peas. Carrots. Squash. Fruits Berries. Pears. Apples. Oranges. Avocados. Prunes and raisins. Dried figs. Meats and Other Protein Sources Navy, kidney, pinto, and  soy beans. Split peas. Lentils. Nuts and seeds. Dairy Fiber-fortified yogurt. Beverages Fiber-fortified soy milk. Fiber-fortified orange juice. Other Fiber bars. The items listed above may not be a complete list of recommended foods or beverages. Contact your dietitian for more options. WHAT FOODS ARE NOT RECOMMENDED? Grains White bread. Pasta made with refined flour. White rice. Vegetables Fried potatoes. Canned vegetables. Well-cooked vegetables.  Fruits Fruit juice. Cooked, strained fruit. Meats and Other Protein Sources Fatty cuts of meat. Fried Sales executive or fried fish. Dairy Milk. Yogurt. Cream cheese. Sour cream. Beverages Soft drinks. Other Cakes and pastries. Butter and oils. The items listed above may not be a  complete list of foods and beverages to avoid. Contact your dietitian for more information. WHAT ARE SOME TIPS FOR INCLUDING HIGH-FIBER FOODS IN MY DIET?  Eat a wide variety of high-fiber foods.  Make sure that half of all grains consumed each day are whole grains.  Replace breads and cereals made from refined flour or white flour with whole-grain breads and cereals.  Replace white rice with brown rice, bulgur wheat, or millet.  Start the day with a breakfast that is high in fiber, such as a cereal that contains at least 5 grams of fiber per serving.  Use beans in place of meat in soups, salads, or pasta.  Eat high-fiber snacks, such as berries, raw vegetables, nuts, or popcorn.   This information is not intended to replace advice given to you by your health care provider. Make sure you discuss any questions you have with your health care provider.   Document Released: 03/04/2005 Document Revised: 03/25/2014 Document Reviewed: 08/17/2013 Elsevier Interactive Patient Education [employer].

## 2015-10-26 ENCOUNTER — Encounter (HOSPITAL_COMMUNITY): Payer: Self-pay | Admitting: Internal Medicine

## 2016-04-23 ENCOUNTER — Encounter: Payer: Self-pay | Admitting: Internal Medicine

## 2016-05-02 DIAGNOSIS — R030 Elevated blood-pressure reading, without diagnosis of hypertension: Secondary | ICD-10-CM | POA: Diagnosis not present

## 2016-05-02 DIAGNOSIS — E039 Hypothyroidism, unspecified: Secondary | ICD-10-CM | POA: Diagnosis not present

## 2016-05-02 DIAGNOSIS — Z6823 Body mass index (BMI) 23.0-23.9, adult: Secondary | ICD-10-CM | POA: Diagnosis not present

## 2016-05-02 DIAGNOSIS — Z0000 Encounter for general adult medical examination without abnormal findings: Secondary | ICD-10-CM | POA: Diagnosis not present

## 2016-05-02 DIAGNOSIS — M25512 Pain in left shoulder: Secondary | ICD-10-CM | POA: Diagnosis not present

## 2016-05-02 DIAGNOSIS — M549 Dorsalgia, unspecified: Secondary | ICD-10-CM | POA: Diagnosis not present

## 2016-05-02 DIAGNOSIS — R69 Illness, unspecified: Secondary | ICD-10-CM | POA: Diagnosis not present

## 2016-05-13 DIAGNOSIS — I10 Essential (primary) hypertension: Secondary | ICD-10-CM | POA: Diagnosis not present

## 2016-05-13 DIAGNOSIS — Z713 Dietary counseling and surveillance: Secondary | ICD-10-CM | POA: Diagnosis not present

## 2016-05-13 DIAGNOSIS — E039 Hypothyroidism, unspecified: Secondary | ICD-10-CM | POA: Diagnosis not present

## 2016-05-13 DIAGNOSIS — J449 Chronic obstructive pulmonary disease, unspecified: Secondary | ICD-10-CM | POA: Diagnosis not present

## 2016-05-13 DIAGNOSIS — Z6822 Body mass index (BMI) 22.0-22.9, adult: Secondary | ICD-10-CM | POA: Diagnosis not present

## 2016-05-13 DIAGNOSIS — Z299 Encounter for prophylactic measures, unspecified: Secondary | ICD-10-CM | POA: Diagnosis not present

## 2016-07-10 DIAGNOSIS — Z7189 Other specified counseling: Secondary | ICD-10-CM | POA: Diagnosis not present

## 2016-07-10 DIAGNOSIS — E039 Hypothyroidism, unspecified: Secondary | ICD-10-CM | POA: Diagnosis not present

## 2016-07-10 DIAGNOSIS — R69 Illness, unspecified: Secondary | ICD-10-CM | POA: Diagnosis not present

## 2016-07-10 DIAGNOSIS — Z6823 Body mass index (BMI) 23.0-23.9, adult: Secondary | ICD-10-CM | POA: Diagnosis not present

## 2016-07-10 DIAGNOSIS — Z79899 Other long term (current) drug therapy: Secondary | ICD-10-CM | POA: Diagnosis not present

## 2016-07-10 DIAGNOSIS — E785 Hyperlipidemia, unspecified: Secondary | ICD-10-CM | POA: Diagnosis not present

## 2016-07-10 DIAGNOSIS — Z1211 Encounter for screening for malignant neoplasm of colon: Secondary | ICD-10-CM | POA: Diagnosis not present

## 2016-07-10 DIAGNOSIS — Z1389 Encounter for screening for other disorder: Secondary | ICD-10-CM | POA: Diagnosis not present

## 2016-07-10 DIAGNOSIS — I10 Essential (primary) hypertension: Secondary | ICD-10-CM | POA: Diagnosis not present

## 2016-07-10 DIAGNOSIS — Z299 Encounter for prophylactic measures, unspecified: Secondary | ICD-10-CM | POA: Diagnosis not present

## 2016-07-10 DIAGNOSIS — Z0000 Encounter for general adult medical examination without abnormal findings: Secondary | ICD-10-CM | POA: Diagnosis not present

## 2016-07-10 DIAGNOSIS — J449 Chronic obstructive pulmonary disease, unspecified: Secondary | ICD-10-CM | POA: Diagnosis not present

## 2016-07-11 DIAGNOSIS — Z1231 Encounter for screening mammogram for malignant neoplasm of breast: Secondary | ICD-10-CM | POA: Diagnosis not present

## 2016-07-19 ENCOUNTER — Other Ambulatory Visit (HOSPITAL_COMMUNITY): Payer: Self-pay | Admitting: Nurse Practitioner

## 2016-07-19 DIAGNOSIS — F17200 Nicotine dependence, unspecified, uncomplicated: Secondary | ICD-10-CM

## 2016-08-06 ENCOUNTER — Ambulatory Visit (HOSPITAL_COMMUNITY)
Admission: RE | Admit: 2016-08-06 | Discharge: 2016-08-06 | Disposition: A | Discharge status: Home / Self Care | Payer: Medicare HMO | Source: Ambulatory Visit | Attending: Nurse Practitioner | Admitting: Nurse Practitioner

## 2016-08-06 DIAGNOSIS — F17210 Nicotine dependence, cigarettes, uncomplicated: Secondary | ICD-10-CM | POA: Diagnosis present

## 2016-08-06 DIAGNOSIS — Z122 Encounter for screening for malignant neoplasm of respiratory organs: Secondary | ICD-10-CM | POA: Diagnosis not present

## 2016-08-06 DIAGNOSIS — I700 Atherosclerosis of aorta: Secondary | ICD-10-CM | POA: Insufficient documentation

## 2016-08-06 DIAGNOSIS — F17200 Nicotine dependence, unspecified, uncomplicated: Secondary | ICD-10-CM

## 2016-08-06 DIAGNOSIS — I2510 Atherosclerotic heart disease of native coronary artery without angina pectoris: Secondary | ICD-10-CM | POA: Diagnosis not present

## 2016-08-06 DIAGNOSIS — J439 Emphysema, unspecified: Secondary | ICD-10-CM | POA: Diagnosis not present

## 2016-08-06 DIAGNOSIS — R69 Illness, unspecified: Secondary | ICD-10-CM | POA: Diagnosis not present

## 2016-08-07 DIAGNOSIS — Z299 Encounter for prophylactic measures, unspecified: Secondary | ICD-10-CM | POA: Diagnosis not present

## 2016-08-07 DIAGNOSIS — R69 Illness, unspecified: Secondary | ICD-10-CM | POA: Diagnosis not present

## 2016-08-07 DIAGNOSIS — E785 Hyperlipidemia, unspecified: Secondary | ICD-10-CM | POA: Diagnosis not present

## 2016-08-07 DIAGNOSIS — I10 Essential (primary) hypertension: Secondary | ICD-10-CM | POA: Diagnosis not present

## 2016-08-07 DIAGNOSIS — E039 Hypothyroidism, unspecified: Secondary | ICD-10-CM | POA: Diagnosis not present

## 2016-08-07 DIAGNOSIS — Z6823 Body mass index (BMI) 23.0-23.9, adult: Secondary | ICD-10-CM | POA: Diagnosis not present

## 2016-08-07 DIAGNOSIS — M810 Age-related osteoporosis without current pathological fracture: Secondary | ICD-10-CM | POA: Diagnosis not present

## 2016-08-07 DIAGNOSIS — Z713 Dietary counseling and surveillance: Secondary | ICD-10-CM | POA: Diagnosis not present

## 2016-08-07 DIAGNOSIS — J449 Chronic obstructive pulmonary disease, unspecified: Secondary | ICD-10-CM | POA: Diagnosis not present

## 2016-11-06 DIAGNOSIS — Z6823 Body mass index (BMI) 23.0-23.9, adult: Secondary | ICD-10-CM | POA: Diagnosis not present

## 2016-11-06 DIAGNOSIS — E039 Hypothyroidism, unspecified: Secondary | ICD-10-CM | POA: Diagnosis not present

## 2016-11-06 DIAGNOSIS — I10 Essential (primary) hypertension: Secondary | ICD-10-CM | POA: Diagnosis not present

## 2016-11-06 DIAGNOSIS — R69 Illness, unspecified: Secondary | ICD-10-CM | POA: Diagnosis not present

## 2016-11-06 DIAGNOSIS — Z299 Encounter for prophylactic measures, unspecified: Secondary | ICD-10-CM | POA: Diagnosis not present

## 2017-01-06 DIAGNOSIS — J0100 Acute maxillary sinusitis, unspecified: Secondary | ICD-10-CM | POA: Diagnosis not present

## 2017-01-06 DIAGNOSIS — Z299 Encounter for prophylactic measures, unspecified: Secondary | ICD-10-CM | POA: Diagnosis not present

## 2017-01-06 DIAGNOSIS — R69 Illness, unspecified: Secondary | ICD-10-CM | POA: Diagnosis not present

## 2017-01-06 DIAGNOSIS — E039 Hypothyroidism, unspecified: Secondary | ICD-10-CM | POA: Diagnosis not present

## 2017-01-06 DIAGNOSIS — I10 Essential (primary) hypertension: Secondary | ICD-10-CM | POA: Diagnosis not present

## 2017-01-06 DIAGNOSIS — Z6823 Body mass index (BMI) 23.0-23.9, adult: Secondary | ICD-10-CM | POA: Diagnosis not present

## 2017-01-06 DIAGNOSIS — J449 Chronic obstructive pulmonary disease, unspecified: Secondary | ICD-10-CM | POA: Diagnosis not present

## 2017-01-22 DIAGNOSIS — R69 Illness, unspecified: Secondary | ICD-10-CM | POA: Diagnosis not present

## 2017-02-10 DIAGNOSIS — Z299 Encounter for prophylactic measures, unspecified: Secondary | ICD-10-CM | POA: Diagnosis not present

## 2017-02-10 DIAGNOSIS — E785 Hyperlipidemia, unspecified: Secondary | ICD-10-CM | POA: Diagnosis not present

## 2017-02-10 DIAGNOSIS — E039 Hypothyroidism, unspecified: Secondary | ICD-10-CM | POA: Diagnosis not present

## 2017-02-10 DIAGNOSIS — R69 Illness, unspecified: Secondary | ICD-10-CM | POA: Diagnosis not present

## 2017-02-10 DIAGNOSIS — I10 Essential (primary) hypertension: Secondary | ICD-10-CM | POA: Diagnosis not present

## 2017-02-10 DIAGNOSIS — Z6822 Body mass index (BMI) 22.0-22.9, adult: Secondary | ICD-10-CM | POA: Diagnosis not present

## 2017-02-10 DIAGNOSIS — J449 Chronic obstructive pulmonary disease, unspecified: Secondary | ICD-10-CM | POA: Diagnosis not present

## 2017-04-07 DIAGNOSIS — J449 Chronic obstructive pulmonary disease, unspecified: Secondary | ICD-10-CM | POA: Diagnosis not present

## 2017-04-07 DIAGNOSIS — J329 Chronic sinusitis, unspecified: Secondary | ICD-10-CM | POA: Diagnosis not present

## 2017-04-07 DIAGNOSIS — Z299 Encounter for prophylactic measures, unspecified: Secondary | ICD-10-CM | POA: Diagnosis not present

## 2017-04-07 DIAGNOSIS — R69 Illness, unspecified: Secondary | ICD-10-CM | POA: Diagnosis not present

## 2017-04-07 DIAGNOSIS — Z6823 Body mass index (BMI) 23.0-23.9, adult: Secondary | ICD-10-CM | POA: Diagnosis not present

## 2017-05-27 DIAGNOSIS — H43812 Vitreous degeneration, left eye: Secondary | ICD-10-CM | POA: Diagnosis not present

## 2017-05-27 DIAGNOSIS — H524 Presbyopia: Secondary | ICD-10-CM | POA: Diagnosis not present

## 2017-05-28 DIAGNOSIS — R69 Illness, unspecified: Secondary | ICD-10-CM | POA: Diagnosis not present

## 2017-07-16 DIAGNOSIS — Z0000 Encounter for general adult medical examination without abnormal findings: Secondary | ICD-10-CM | POA: Diagnosis not present

## 2017-07-16 DIAGNOSIS — J449 Chronic obstructive pulmonary disease, unspecified: Secondary | ICD-10-CM | POA: Diagnosis not present

## 2017-07-16 DIAGNOSIS — I10 Essential (primary) hypertension: Secondary | ICD-10-CM | POA: Diagnosis not present

## 2017-07-16 DIAGNOSIS — Z79899 Other long term (current) drug therapy: Secondary | ICD-10-CM | POA: Diagnosis not present

## 2017-07-16 DIAGNOSIS — E785 Hyperlipidemia, unspecified: Secondary | ICD-10-CM | POA: Diagnosis not present

## 2017-07-16 DIAGNOSIS — Z7189 Other specified counseling: Secondary | ICD-10-CM | POA: Diagnosis not present

## 2017-07-16 DIAGNOSIS — E039 Hypothyroidism, unspecified: Secondary | ICD-10-CM | POA: Diagnosis not present

## 2017-07-16 DIAGNOSIS — Z1211 Encounter for screening for malignant neoplasm of colon: Secondary | ICD-10-CM | POA: Diagnosis not present

## 2017-07-16 DIAGNOSIS — Z1331 Encounter for screening for depression: Secondary | ICD-10-CM | POA: Diagnosis not present

## 2017-07-16 DIAGNOSIS — Z299 Encounter for prophylactic measures, unspecified: Secondary | ICD-10-CM | POA: Diagnosis not present

## 2017-07-16 DIAGNOSIS — Z1339 Encounter for screening examination for other mental health and behavioral disorders: Secondary | ICD-10-CM | POA: Diagnosis not present

## 2017-07-16 DIAGNOSIS — Z6822 Body mass index (BMI) 22.0-22.9, adult: Secondary | ICD-10-CM | POA: Diagnosis not present

## 2017-07-18 DIAGNOSIS — Z1231 Encounter for screening mammogram for malignant neoplasm of breast: Secondary | ICD-10-CM | POA: Diagnosis not present

## 2017-12-06 DIAGNOSIS — R69 Illness, unspecified: Secondary | ICD-10-CM | POA: Diagnosis not present

## 2017-12-09 DIAGNOSIS — R69 Illness, unspecified: Secondary | ICD-10-CM | POA: Diagnosis not present

## 2017-12-13 DIAGNOSIS — R69 Illness, unspecified: Secondary | ICD-10-CM | POA: Diagnosis not present

## 2018-07-22 DIAGNOSIS — Z1211 Encounter for screening for malignant neoplasm of colon: Secondary | ICD-10-CM | POA: Diagnosis not present

## 2018-07-22 DIAGNOSIS — Z7189 Other specified counseling: Secondary | ICD-10-CM | POA: Diagnosis not present

## 2018-07-22 DIAGNOSIS — Z299 Encounter for prophylactic measures, unspecified: Secondary | ICD-10-CM | POA: Diagnosis not present

## 2018-07-22 DIAGNOSIS — Z6821 Body mass index (BMI) 21.0-21.9, adult: Secondary | ICD-10-CM | POA: Diagnosis not present

## 2018-07-22 DIAGNOSIS — E785 Hyperlipidemia, unspecified: Secondary | ICD-10-CM | POA: Diagnosis not present

## 2018-07-22 DIAGNOSIS — Z0000 Encounter for general adult medical examination without abnormal findings: Secondary | ICD-10-CM | POA: Diagnosis not present

## 2018-07-22 DIAGNOSIS — Z1339 Encounter for screening examination for other mental health and behavioral disorders: Secondary | ICD-10-CM | POA: Diagnosis not present

## 2018-07-22 DIAGNOSIS — Z1331 Encounter for screening for depression: Secondary | ICD-10-CM | POA: Diagnosis not present

## 2018-07-22 DIAGNOSIS — E039 Hypothyroidism, unspecified: Secondary | ICD-10-CM | POA: Diagnosis not present

## 2018-07-22 DIAGNOSIS — Z79899 Other long term (current) drug therapy: Secondary | ICD-10-CM | POA: Diagnosis not present

## 2018-07-22 DIAGNOSIS — I10 Essential (primary) hypertension: Secondary | ICD-10-CM | POA: Diagnosis not present

## 2018-10-22 DIAGNOSIS — Z1231 Encounter for screening mammogram for malignant neoplasm of breast: Secondary | ICD-10-CM | POA: Diagnosis not present

## 2018-11-03 DIAGNOSIS — R69 Illness, unspecified: Secondary | ICD-10-CM | POA: Diagnosis not present

## 2018-12-17 DIAGNOSIS — R69 Illness, unspecified: Secondary | ICD-10-CM | POA: Diagnosis not present

## 2018-12-24 DIAGNOSIS — R69 Illness, unspecified: Secondary | ICD-10-CM | POA: Diagnosis not present

## 2019-01-03 DIAGNOSIS — R69 Illness, unspecified: Secondary | ICD-10-CM | POA: Diagnosis not present

## 2019-01-12 DIAGNOSIS — R69 Illness, unspecified: Secondary | ICD-10-CM | POA: Diagnosis not present

## 2019-01-12 DIAGNOSIS — Z299 Encounter for prophylactic measures, unspecified: Secondary | ICD-10-CM | POA: Diagnosis not present

## 2019-01-12 DIAGNOSIS — E039 Hypothyroidism, unspecified: Secondary | ICD-10-CM | POA: Diagnosis not present

## 2019-01-12 DIAGNOSIS — I10 Essential (primary) hypertension: Secondary | ICD-10-CM | POA: Diagnosis not present

## 2019-01-12 DIAGNOSIS — Z6821 Body mass index (BMI) 21.0-21.9, adult: Secondary | ICD-10-CM | POA: Diagnosis not present

## 2019-01-12 DIAGNOSIS — J449 Chronic obstructive pulmonary disease, unspecified: Secondary | ICD-10-CM | POA: Diagnosis not present

## 2019-01-13 DIAGNOSIS — R69 Illness, unspecified: Secondary | ICD-10-CM | POA: Diagnosis not present

## 2019-01-18 DIAGNOSIS — I700 Atherosclerosis of aorta: Secondary | ICD-10-CM | POA: Diagnosis not present

## 2019-03-15 DIAGNOSIS — E039 Hypothyroidism, unspecified: Secondary | ICD-10-CM | POA: Diagnosis not present

## 2019-04-29 DIAGNOSIS — R69 Illness, unspecified: Secondary | ICD-10-CM | POA: Diagnosis not present

## 2019-05-04 DIAGNOSIS — R69 Illness, unspecified: Secondary | ICD-10-CM | POA: Diagnosis not present

## 2019-06-02 DIAGNOSIS — Z23 Encounter for immunization: Secondary | ICD-10-CM | POA: Diagnosis not present

## 2019-06-30 DIAGNOSIS — Z23 Encounter for immunization: Secondary | ICD-10-CM | POA: Diagnosis not present

## 2019-07-08 DIAGNOSIS — I10 Essential (primary) hypertension: Secondary | ICD-10-CM | POA: Diagnosis not present

## 2019-07-08 DIAGNOSIS — J329 Chronic sinusitis, unspecified: Secondary | ICD-10-CM | POA: Diagnosis not present

## 2019-07-08 DIAGNOSIS — I739 Peripheral vascular disease, unspecified: Secondary | ICD-10-CM | POA: Diagnosis not present

## 2019-07-08 DIAGNOSIS — Z299 Encounter for prophylactic measures, unspecified: Secondary | ICD-10-CM | POA: Diagnosis not present

## 2019-07-08 DIAGNOSIS — J449 Chronic obstructive pulmonary disease, unspecified: Secondary | ICD-10-CM | POA: Diagnosis not present

## 2019-07-08 DIAGNOSIS — R69 Illness, unspecified: Secondary | ICD-10-CM | POA: Diagnosis not present

## 2019-07-12 DIAGNOSIS — J329 Chronic sinusitis, unspecified: Secondary | ICD-10-CM | POA: Diagnosis not present

## 2019-07-12 DIAGNOSIS — Z299 Encounter for prophylactic measures, unspecified: Secondary | ICD-10-CM | POA: Diagnosis not present

## 2019-07-12 DIAGNOSIS — J449 Chronic obstructive pulmonary disease, unspecified: Secondary | ICD-10-CM | POA: Diagnosis not present

## 2019-07-12 DIAGNOSIS — I10 Essential (primary) hypertension: Secondary | ICD-10-CM | POA: Diagnosis not present

## 2019-07-12 DIAGNOSIS — R69 Illness, unspecified: Secondary | ICD-10-CM | POA: Diagnosis not present

## 2019-07-13 DIAGNOSIS — R05 Cough: Secondary | ICD-10-CM | POA: Diagnosis not present

## 2019-07-13 DIAGNOSIS — R0602 Shortness of breath: Secondary | ICD-10-CM | POA: Diagnosis not present

## 2019-07-28 DIAGNOSIS — Z1211 Encounter for screening for malignant neoplasm of colon: Secondary | ICD-10-CM | POA: Diagnosis not present

## 2019-07-28 DIAGNOSIS — Z299 Encounter for prophylactic measures, unspecified: Secondary | ICD-10-CM | POA: Diagnosis not present

## 2019-07-28 DIAGNOSIS — E7800 Pure hypercholesterolemia, unspecified: Secondary | ICD-10-CM | POA: Diagnosis not present

## 2019-07-28 DIAGNOSIS — E039 Hypothyroidism, unspecified: Secondary | ICD-10-CM | POA: Diagnosis not present

## 2019-07-28 DIAGNOSIS — Z7189 Other specified counseling: Secondary | ICD-10-CM | POA: Diagnosis not present

## 2019-07-28 DIAGNOSIS — Z1339 Encounter for screening examination for other mental health and behavioral disorders: Secondary | ICD-10-CM | POA: Diagnosis not present

## 2019-07-28 DIAGNOSIS — Z1331 Encounter for screening for depression: Secondary | ICD-10-CM | POA: Diagnosis not present

## 2019-07-28 DIAGNOSIS — Z0000 Encounter for general adult medical examination without abnormal findings: Secondary | ICD-10-CM | POA: Diagnosis not present

## 2019-07-28 DIAGNOSIS — Z6820 Body mass index (BMI) 20.0-20.9, adult: Secondary | ICD-10-CM | POA: Diagnosis not present

## 2019-07-28 DIAGNOSIS — Z79899 Other long term (current) drug therapy: Secondary | ICD-10-CM | POA: Diagnosis not present

## 2019-07-28 DIAGNOSIS — R69 Illness, unspecified: Secondary | ICD-10-CM | POA: Diagnosis not present

## 2019-07-28 DIAGNOSIS — I10 Essential (primary) hypertension: Secondary | ICD-10-CM | POA: Diagnosis not present

## 2019-08-09 DIAGNOSIS — Z7952 Long term (current) use of systemic steroids: Secondary | ICD-10-CM | POA: Diagnosis not present

## 2019-08-09 DIAGNOSIS — M859 Disorder of bone density and structure, unspecified: Secondary | ICD-10-CM | POA: Diagnosis not present

## 2019-08-09 DIAGNOSIS — Z79899 Other long term (current) drug therapy: Secondary | ICD-10-CM | POA: Diagnosis not present

## 2019-08-09 DIAGNOSIS — E2839 Other primary ovarian failure: Secondary | ICD-10-CM | POA: Diagnosis not present

## 2019-08-18 DIAGNOSIS — I10 Essential (primary) hypertension: Secondary | ICD-10-CM | POA: Diagnosis not present

## 2019-08-18 DIAGNOSIS — I700 Atherosclerosis of aorta: Secondary | ICD-10-CM | POA: Diagnosis not present

## 2019-08-18 DIAGNOSIS — J449 Chronic obstructive pulmonary disease, unspecified: Secondary | ICD-10-CM | POA: Diagnosis not present

## 2019-08-18 DIAGNOSIS — Z299 Encounter for prophylactic measures, unspecified: Secondary | ICD-10-CM | POA: Diagnosis not present

## 2019-08-18 DIAGNOSIS — R69 Illness, unspecified: Secondary | ICD-10-CM | POA: Diagnosis not present

## 2019-08-18 DIAGNOSIS — M810 Age-related osteoporosis without current pathological fracture: Secondary | ICD-10-CM | POA: Diagnosis not present

## 2019-09-09 DIAGNOSIS — R69 Illness, unspecified: Secondary | ICD-10-CM | POA: Diagnosis not present

## 2019-12-09 DIAGNOSIS — I700 Atherosclerosis of aorta: Secondary | ICD-10-CM | POA: Diagnosis not present

## 2019-12-09 DIAGNOSIS — Z299 Encounter for prophylactic measures, unspecified: Secondary | ICD-10-CM | POA: Diagnosis not present

## 2019-12-09 DIAGNOSIS — M25511 Pain in right shoulder: Secondary | ICD-10-CM | POA: Diagnosis not present

## 2019-12-09 DIAGNOSIS — I739 Peripheral vascular disease, unspecified: Secondary | ICD-10-CM | POA: Diagnosis not present

## 2019-12-09 DIAGNOSIS — J449 Chronic obstructive pulmonary disease, unspecified: Secondary | ICD-10-CM | POA: Diagnosis not present

## 2019-12-09 DIAGNOSIS — R69 Illness, unspecified: Secondary | ICD-10-CM | POA: Diagnosis not present

## 2019-12-16 DIAGNOSIS — Z79891 Long term (current) use of opiate analgesic: Secondary | ICD-10-CM | POA: Diagnosis not present

## 2019-12-16 DIAGNOSIS — G894 Chronic pain syndrome: Secondary | ICD-10-CM | POA: Diagnosis not present

## 2019-12-16 DIAGNOSIS — G5601 Carpal tunnel syndrome, right upper limb: Secondary | ICD-10-CM | POA: Diagnosis not present

## 2019-12-16 DIAGNOSIS — M961 Postlaminectomy syndrome, not elsewhere classified: Secondary | ICD-10-CM | POA: Diagnosis not present

## 2019-12-23 DIAGNOSIS — R69 Illness, unspecified: Secondary | ICD-10-CM | POA: Diagnosis not present

## 2020-01-28 DIAGNOSIS — J069 Acute upper respiratory infection, unspecified: Secondary | ICD-10-CM | POA: Diagnosis not present

## 2020-01-28 DIAGNOSIS — I739 Peripheral vascular disease, unspecified: Secondary | ICD-10-CM | POA: Diagnosis not present

## 2020-01-28 DIAGNOSIS — Z299 Encounter for prophylactic measures, unspecified: Secondary | ICD-10-CM | POA: Diagnosis not present

## 2020-01-28 DIAGNOSIS — J449 Chronic obstructive pulmonary disease, unspecified: Secondary | ICD-10-CM | POA: Diagnosis not present

## 2020-01-28 DIAGNOSIS — I700 Atherosclerosis of aorta: Secondary | ICD-10-CM | POA: Diagnosis not present

## 2020-01-28 DIAGNOSIS — R69 Illness, unspecified: Secondary | ICD-10-CM | POA: Diagnosis not present

## 2020-02-01 DIAGNOSIS — R69 Illness, unspecified: Secondary | ICD-10-CM | POA: Diagnosis not present

## 2020-02-03 DIAGNOSIS — J441 Chronic obstructive pulmonary disease with (acute) exacerbation: Secondary | ICD-10-CM | POA: Diagnosis not present

## 2020-02-03 DIAGNOSIS — I700 Atherosclerosis of aorta: Secondary | ICD-10-CM | POA: Diagnosis not present

## 2020-02-03 DIAGNOSIS — I739 Peripheral vascular disease, unspecified: Secondary | ICD-10-CM | POA: Diagnosis not present

## 2020-02-03 DIAGNOSIS — Z299 Encounter for prophylactic measures, unspecified: Secondary | ICD-10-CM | POA: Diagnosis not present

## 2020-02-03 DIAGNOSIS — R69 Illness, unspecified: Secondary | ICD-10-CM | POA: Diagnosis not present

## 2020-02-03 DIAGNOSIS — J449 Chronic obstructive pulmonary disease, unspecified: Secondary | ICD-10-CM | POA: Diagnosis not present

## 2020-02-07 DIAGNOSIS — G5601 Carpal tunnel syndrome, right upper limb: Secondary | ICD-10-CM | POA: Diagnosis not present

## 2020-02-07 DIAGNOSIS — G894 Chronic pain syndrome: Secondary | ICD-10-CM | POA: Diagnosis not present

## 2020-02-07 DIAGNOSIS — Z79891 Long term (current) use of opiate analgesic: Secondary | ICD-10-CM | POA: Diagnosis not present

## 2020-02-07 DIAGNOSIS — M961 Postlaminectomy syndrome, not elsewhere classified: Secondary | ICD-10-CM | POA: Diagnosis not present

## 2020-03-12 DIAGNOSIS — J209 Acute bronchitis, unspecified: Secondary | ICD-10-CM | POA: Diagnosis not present

## 2020-03-12 DIAGNOSIS — J440 Chronic obstructive pulmonary disease with acute lower respiratory infection: Secondary | ICD-10-CM | POA: Diagnosis not present

## 2020-05-10 DIAGNOSIS — F17210 Nicotine dependence, cigarettes, uncomplicated: Secondary | ICD-10-CM | POA: Diagnosis not present

## 2020-05-10 DIAGNOSIS — E039 Hypothyroidism, unspecified: Secondary | ICD-10-CM | POA: Diagnosis not present

## 2020-05-10 DIAGNOSIS — D171 Benign lipomatous neoplasm of skin and subcutaneous tissue of trunk: Secondary | ICD-10-CM | POA: Diagnosis not present

## 2020-05-10 DIAGNOSIS — R5383 Other fatigue: Secondary | ICD-10-CM | POA: Diagnosis not present

## 2020-05-10 DIAGNOSIS — R69 Illness, unspecified: Secondary | ICD-10-CM | POA: Diagnosis not present

## 2020-05-10 DIAGNOSIS — I10 Essential (primary) hypertension: Secondary | ICD-10-CM | POA: Diagnosis not present

## 2020-05-10 DIAGNOSIS — Z299 Encounter for prophylactic measures, unspecified: Secondary | ICD-10-CM | POA: Diagnosis not present

## 2020-05-10 DIAGNOSIS — Z681 Body mass index (BMI) 19 or less, adult: Secondary | ICD-10-CM | POA: Diagnosis not present

## 2020-05-22 DIAGNOSIS — K458 Other specified abdominal hernia without obstruction or gangrene: Secondary | ICD-10-CM | POA: Insufficient documentation

## 2020-05-22 DIAGNOSIS — K432 Incisional hernia without obstruction or gangrene: Secondary | ICD-10-CM | POA: Diagnosis not present

## 2020-05-31 DIAGNOSIS — D1803 Hemangioma of intra-abdominal structures: Secondary | ICD-10-CM | POA: Diagnosis not present

## 2020-05-31 DIAGNOSIS — K439 Ventral hernia without obstruction or gangrene: Secondary | ICD-10-CM | POA: Diagnosis not present

## 2020-05-31 DIAGNOSIS — K4090 Unilateral inguinal hernia, without obstruction or gangrene, not specified as recurrent: Secondary | ICD-10-CM | POA: Diagnosis not present

## 2020-05-31 DIAGNOSIS — K432 Incisional hernia without obstruction or gangrene: Secondary | ICD-10-CM | POA: Diagnosis not present

## 2020-05-31 DIAGNOSIS — K458 Other specified abdominal hernia without obstruction or gangrene: Secondary | ICD-10-CM | POA: Diagnosis not present

## 2020-05-31 DIAGNOSIS — R1909 Other intra-abdominal and pelvic swelling, mass and lump: Secondary | ICD-10-CM | POA: Diagnosis not present

## 2020-06-05 DIAGNOSIS — K458 Other specified abdominal hernia without obstruction or gangrene: Secondary | ICD-10-CM | POA: Diagnosis not present

## 2020-06-30 DIAGNOSIS — Z01812 Encounter for preprocedural laboratory examination: Secondary | ICD-10-CM | POA: Diagnosis not present

## 2020-06-30 DIAGNOSIS — Z01818 Encounter for other preprocedural examination: Secondary | ICD-10-CM | POA: Diagnosis not present

## 2020-06-30 DIAGNOSIS — Z20822 Contact with and (suspected) exposure to covid-19: Secondary | ICD-10-CM | POA: Diagnosis not present

## 2020-06-30 DIAGNOSIS — Z01810 Encounter for preprocedural cardiovascular examination: Secondary | ICD-10-CM | POA: Diagnosis not present

## 2020-07-10 DIAGNOSIS — Z299 Encounter for prophylactic measures, unspecified: Secondary | ICD-10-CM | POA: Diagnosis not present

## 2020-07-10 DIAGNOSIS — Z720 Tobacco use: Secondary | ICD-10-CM | POA: Diagnosis not present

## 2020-07-10 DIAGNOSIS — I739 Peripheral vascular disease, unspecified: Secondary | ICD-10-CM | POA: Diagnosis not present

## 2020-07-10 DIAGNOSIS — J449 Chronic obstructive pulmonary disease, unspecified: Secondary | ICD-10-CM | POA: Diagnosis not present

## 2020-07-10 DIAGNOSIS — I10 Essential (primary) hypertension: Secondary | ICD-10-CM | POA: Diagnosis not present

## 2020-07-10 DIAGNOSIS — R69 Illness, unspecified: Secondary | ICD-10-CM | POA: Diagnosis not present

## 2020-07-12 DIAGNOSIS — R0602 Shortness of breath: Secondary | ICD-10-CM | POA: Diagnosis not present

## 2020-07-12 DIAGNOSIS — I739 Peripheral vascular disease, unspecified: Secondary | ICD-10-CM | POA: Diagnosis not present

## 2020-07-12 DIAGNOSIS — J449 Chronic obstructive pulmonary disease, unspecified: Secondary | ICD-10-CM | POA: Diagnosis not present

## 2020-07-12 DIAGNOSIS — I700 Atherosclerosis of aorta: Secondary | ICD-10-CM | POA: Diagnosis not present

## 2020-07-18 DIAGNOSIS — R69 Illness, unspecified: Secondary | ICD-10-CM | POA: Diagnosis not present

## 2020-07-18 DIAGNOSIS — F17210 Nicotine dependence, cigarettes, uncomplicated: Secondary | ICD-10-CM | POA: Insufficient documentation

## 2020-07-18 DIAGNOSIS — I10 Essential (primary) hypertension: Secondary | ICD-10-CM | POA: Diagnosis not present

## 2020-07-18 DIAGNOSIS — I25118 Atherosclerotic heart disease of native coronary artery with other forms of angina pectoris: Secondary | ICD-10-CM | POA: Insufficient documentation

## 2020-08-02 DIAGNOSIS — F17210 Nicotine dependence, cigarettes, uncomplicated: Secondary | ICD-10-CM | POA: Diagnosis not present

## 2020-08-02 DIAGNOSIS — Z1339 Encounter for screening examination for other mental health and behavioral disorders: Secondary | ICD-10-CM | POA: Diagnosis not present

## 2020-08-02 DIAGNOSIS — Z1331 Encounter for screening for depression: Secondary | ICD-10-CM | POA: Diagnosis not present

## 2020-08-02 DIAGNOSIS — E7800 Pure hypercholesterolemia, unspecified: Secondary | ICD-10-CM | POA: Diagnosis not present

## 2020-08-02 DIAGNOSIS — Z0000 Encounter for general adult medical examination without abnormal findings: Secondary | ICD-10-CM | POA: Diagnosis not present

## 2020-08-02 DIAGNOSIS — Z681 Body mass index (BMI) 19 or less, adult: Secondary | ICD-10-CM | POA: Diagnosis not present

## 2020-08-02 DIAGNOSIS — Z7189 Other specified counseling: Secondary | ICD-10-CM | POA: Diagnosis not present

## 2020-08-02 DIAGNOSIS — Z299 Encounter for prophylactic measures, unspecified: Secondary | ICD-10-CM | POA: Diagnosis not present

## 2020-08-02 DIAGNOSIS — I10 Essential (primary) hypertension: Secondary | ICD-10-CM | POA: Diagnosis not present

## 2020-08-02 DIAGNOSIS — Z79899 Other long term (current) drug therapy: Secondary | ICD-10-CM | POA: Diagnosis not present

## 2020-08-02 DIAGNOSIS — R69 Illness, unspecified: Secondary | ICD-10-CM | POA: Diagnosis not present

## 2020-08-02 DIAGNOSIS — M810 Age-related osteoporosis without current pathological fracture: Secondary | ICD-10-CM | POA: Diagnosis not present

## 2020-08-02 DIAGNOSIS — E039 Hypothyroidism, unspecified: Secondary | ICD-10-CM | POA: Diagnosis not present

## 2020-08-04 DIAGNOSIS — I2510 Atherosclerotic heart disease of native coronary artery without angina pectoris: Secondary | ICD-10-CM | POA: Diagnosis not present

## 2020-08-04 DIAGNOSIS — I77810 Thoracic aortic ectasia: Secondary | ICD-10-CM | POA: Diagnosis not present

## 2020-08-04 DIAGNOSIS — I340 Nonrheumatic mitral (valve) insufficiency: Secondary | ICD-10-CM | POA: Diagnosis not present

## 2020-08-04 DIAGNOSIS — I77819 Aortic ectasia, unspecified site: Secondary | ICD-10-CM | POA: Diagnosis not present

## 2020-08-04 DIAGNOSIS — I517 Cardiomegaly: Secondary | ICD-10-CM | POA: Diagnosis not present

## 2020-08-04 DIAGNOSIS — I25118 Atherosclerotic heart disease of native coronary artery with other forms of angina pectoris: Secondary | ICD-10-CM | POA: Diagnosis not present

## 2020-08-04 DIAGNOSIS — Z01810 Encounter for preprocedural cardiovascular examination: Secondary | ICD-10-CM | POA: Diagnosis not present

## 2020-08-09 DIAGNOSIS — Z299 Encounter for prophylactic measures, unspecified: Secondary | ICD-10-CM | POA: Diagnosis not present

## 2020-08-09 DIAGNOSIS — F17210 Nicotine dependence, cigarettes, uncomplicated: Secondary | ICD-10-CM | POA: Diagnosis not present

## 2020-08-09 DIAGNOSIS — I10 Essential (primary) hypertension: Secondary | ICD-10-CM | POA: Diagnosis not present

## 2020-08-09 DIAGNOSIS — J449 Chronic obstructive pulmonary disease, unspecified: Secondary | ICD-10-CM | POA: Diagnosis not present

## 2020-08-09 DIAGNOSIS — J441 Chronic obstructive pulmonary disease with (acute) exacerbation: Secondary | ICD-10-CM | POA: Diagnosis not present

## 2020-08-09 DIAGNOSIS — R69 Illness, unspecified: Secondary | ICD-10-CM | POA: Diagnosis not present

## 2020-10-05 DIAGNOSIS — E039 Hypothyroidism, unspecified: Secondary | ICD-10-CM | POA: Diagnosis not present

## 2020-10-10 ENCOUNTER — Encounter (INDEPENDENT_AMBULATORY_CARE_PROVIDER_SITE_OTHER): Payer: Self-pay | Admitting: *Deleted

## 2020-10-17 DIAGNOSIS — I25118 Atherosclerotic heart disease of native coronary artery with other forms of angina pectoris: Secondary | ICD-10-CM | POA: Diagnosis not present

## 2020-10-17 DIAGNOSIS — I10 Essential (primary) hypertension: Secondary | ICD-10-CM | POA: Diagnosis not present

## 2020-11-17 DIAGNOSIS — Z1231 Encounter for screening mammogram for malignant neoplasm of breast: Secondary | ICD-10-CM | POA: Diagnosis not present

## 2020-11-22 DIAGNOSIS — I10 Essential (primary) hypertension: Secondary | ICD-10-CM | POA: Diagnosis not present

## 2020-11-22 DIAGNOSIS — Z299 Encounter for prophylactic measures, unspecified: Secondary | ICD-10-CM | POA: Diagnosis not present

## 2020-11-22 DIAGNOSIS — I739 Peripheral vascular disease, unspecified: Secondary | ICD-10-CM | POA: Diagnosis not present

## 2020-11-22 DIAGNOSIS — J449 Chronic obstructive pulmonary disease, unspecified: Secondary | ICD-10-CM | POA: Diagnosis not present

## 2020-11-22 DIAGNOSIS — I700 Atherosclerosis of aorta: Secondary | ICD-10-CM | POA: Diagnosis not present

## 2020-12-04 DIAGNOSIS — G894 Chronic pain syndrome: Secondary | ICD-10-CM | POA: Diagnosis not present

## 2020-12-04 DIAGNOSIS — Z79891 Long term (current) use of opiate analgesic: Secondary | ICD-10-CM | POA: Diagnosis not present

## 2020-12-08 DIAGNOSIS — Z01818 Encounter for other preprocedural examination: Secondary | ICD-10-CM | POA: Diagnosis not present

## 2020-12-08 DIAGNOSIS — K458 Other specified abdominal hernia without obstruction or gangrene: Secondary | ICD-10-CM | POA: Diagnosis not present

## 2020-12-26 DIAGNOSIS — E039 Hypothyroidism, unspecified: Secondary | ICD-10-CM | POA: Diagnosis not present

## 2020-12-26 DIAGNOSIS — I10 Essential (primary) hypertension: Secondary | ICD-10-CM | POA: Diagnosis not present

## 2020-12-26 DIAGNOSIS — J449 Chronic obstructive pulmonary disease, unspecified: Secondary | ICD-10-CM | POA: Diagnosis not present

## 2020-12-26 DIAGNOSIS — K458 Other specified abdominal hernia without obstruction or gangrene: Secondary | ICD-10-CM | POA: Diagnosis not present

## 2020-12-26 DIAGNOSIS — K429 Umbilical hernia without obstruction or gangrene: Secondary | ICD-10-CM | POA: Diagnosis not present

## 2020-12-26 DIAGNOSIS — K450 Other specified abdominal hernia with obstruction, without gangrene: Secondary | ICD-10-CM | POA: Diagnosis not present

## 2021-01-29 DIAGNOSIS — Z79891 Long term (current) use of opiate analgesic: Secondary | ICD-10-CM | POA: Diagnosis not present

## 2021-01-29 DIAGNOSIS — G5601 Carpal tunnel syndrome, right upper limb: Secondary | ICD-10-CM | POA: Diagnosis not present

## 2021-01-29 DIAGNOSIS — M961 Postlaminectomy syndrome, not elsewhere classified: Secondary | ICD-10-CM | POA: Diagnosis not present

## 2021-01-29 DIAGNOSIS — G894 Chronic pain syndrome: Secondary | ICD-10-CM | POA: Diagnosis not present

## 2021-03-26 DIAGNOSIS — G894 Chronic pain syndrome: Secondary | ICD-10-CM | POA: Diagnosis not present

## 2021-03-26 DIAGNOSIS — G5601 Carpal tunnel syndrome, right upper limb: Secondary | ICD-10-CM | POA: Diagnosis not present

## 2021-03-26 DIAGNOSIS — Z79891 Long term (current) use of opiate analgesic: Secondary | ICD-10-CM | POA: Diagnosis not present

## 2021-03-26 DIAGNOSIS — M961 Postlaminectomy syndrome, not elsewhere classified: Secondary | ICD-10-CM | POA: Diagnosis not present

## 2021-04-03 DIAGNOSIS — Z299 Encounter for prophylactic measures, unspecified: Secondary | ICD-10-CM | POA: Diagnosis not present

## 2021-04-03 DIAGNOSIS — I700 Atherosclerosis of aorta: Secondary | ICD-10-CM | POA: Diagnosis not present

## 2021-04-03 DIAGNOSIS — I10 Essential (primary) hypertension: Secondary | ICD-10-CM | POA: Diagnosis not present

## 2021-04-03 DIAGNOSIS — I739 Peripheral vascular disease, unspecified: Secondary | ICD-10-CM | POA: Diagnosis not present

## 2021-04-03 DIAGNOSIS — E039 Hypothyroidism, unspecified: Secondary | ICD-10-CM | POA: Diagnosis not present

## 2021-05-22 DIAGNOSIS — M961 Postlaminectomy syndrome, not elsewhere classified: Secondary | ICD-10-CM | POA: Diagnosis not present

## 2021-05-22 DIAGNOSIS — G5601 Carpal tunnel syndrome, right upper limb: Secondary | ICD-10-CM | POA: Diagnosis not present

## 2021-05-22 DIAGNOSIS — Z79891 Long term (current) use of opiate analgesic: Secondary | ICD-10-CM | POA: Diagnosis not present

## 2021-05-22 DIAGNOSIS — G894 Chronic pain syndrome: Secondary | ICD-10-CM | POA: Diagnosis not present

## 2021-05-30 DIAGNOSIS — E039 Hypothyroidism, unspecified: Secondary | ICD-10-CM | POA: Diagnosis not present

## 2021-07-17 DIAGNOSIS — M961 Postlaminectomy syndrome, not elsewhere classified: Secondary | ICD-10-CM | POA: Diagnosis not present

## 2021-07-17 DIAGNOSIS — M4722 Other spondylosis with radiculopathy, cervical region: Secondary | ICD-10-CM | POA: Diagnosis not present

## 2021-07-17 DIAGNOSIS — G894 Chronic pain syndrome: Secondary | ICD-10-CM | POA: Diagnosis not present

## 2021-07-17 DIAGNOSIS — Z79891 Long term (current) use of opiate analgesic: Secondary | ICD-10-CM | POA: Diagnosis not present

## 2021-08-09 DIAGNOSIS — Z0000 Encounter for general adult medical examination without abnormal findings: Secondary | ICD-10-CM | POA: Diagnosis not present

## 2021-08-09 DIAGNOSIS — E039 Hypothyroidism, unspecified: Secondary | ICD-10-CM | POA: Diagnosis not present

## 2021-08-09 DIAGNOSIS — E559 Vitamin D deficiency, unspecified: Secondary | ICD-10-CM | POA: Diagnosis not present

## 2021-08-09 DIAGNOSIS — F17210 Nicotine dependence, cigarettes, uncomplicated: Secondary | ICD-10-CM | POA: Diagnosis not present

## 2021-08-09 DIAGNOSIS — J449 Chronic obstructive pulmonary disease, unspecified: Secondary | ICD-10-CM | POA: Diagnosis not present

## 2021-08-09 DIAGNOSIS — Z299 Encounter for prophylactic measures, unspecified: Secondary | ICD-10-CM | POA: Diagnosis not present

## 2021-08-09 DIAGNOSIS — Z7189 Other specified counseling: Secondary | ICD-10-CM | POA: Diagnosis not present

## 2021-08-09 DIAGNOSIS — Z681 Body mass index (BMI) 19 or less, adult: Secondary | ICD-10-CM | POA: Diagnosis not present

## 2021-08-09 DIAGNOSIS — I10 Essential (primary) hypertension: Secondary | ICD-10-CM | POA: Diagnosis not present

## 2021-08-21 DIAGNOSIS — M859 Disorder of bone density and structure, unspecified: Secondary | ICD-10-CM | POA: Diagnosis not present

## 2021-08-21 DIAGNOSIS — M818 Other osteoporosis without current pathological fracture: Secondary | ICD-10-CM | POA: Diagnosis not present

## 2021-08-21 DIAGNOSIS — Z79899 Other long term (current) drug therapy: Secondary | ICD-10-CM | POA: Diagnosis not present

## 2021-09-04 DIAGNOSIS — M810 Age-related osteoporosis without current pathological fracture: Secondary | ICD-10-CM | POA: Insufficient documentation

## 2021-09-05 DIAGNOSIS — M810 Age-related osteoporosis without current pathological fracture: Secondary | ICD-10-CM | POA: Diagnosis not present

## 2021-09-10 DIAGNOSIS — M961 Postlaminectomy syndrome, not elsewhere classified: Secondary | ICD-10-CM | POA: Diagnosis not present

## 2021-09-10 DIAGNOSIS — M4722 Other spondylosis with radiculopathy, cervical region: Secondary | ICD-10-CM | POA: Diagnosis not present

## 2021-09-10 DIAGNOSIS — Z79891 Long term (current) use of opiate analgesic: Secondary | ICD-10-CM | POA: Diagnosis not present

## 2021-09-10 DIAGNOSIS — G894 Chronic pain syndrome: Secondary | ICD-10-CM | POA: Diagnosis not present

## 2021-09-12 DIAGNOSIS — Z800 Family history of malignant neoplasm of digestive organs: Secondary | ICD-10-CM | POA: Insufficient documentation

## 2021-10-16 DIAGNOSIS — I10 Essential (primary) hypertension: Secondary | ICD-10-CM | POA: Diagnosis not present

## 2021-10-16 DIAGNOSIS — I25118 Atherosclerotic heart disease of native coronary artery with other forms of angina pectoris: Secondary | ICD-10-CM | POA: Diagnosis not present

## 2021-11-20 DIAGNOSIS — Z1231 Encounter for screening mammogram for malignant neoplasm of breast: Secondary | ICD-10-CM | POA: Diagnosis not present

## 2021-11-26 DIAGNOSIS — H25812 Combined forms of age-related cataract, left eye: Secondary | ICD-10-CM | POA: Diagnosis not present

## 2021-11-26 DIAGNOSIS — H25811 Combined forms of age-related cataract, right eye: Secondary | ICD-10-CM | POA: Diagnosis not present

## 2021-11-30 DIAGNOSIS — J209 Acute bronchitis, unspecified: Secondary | ICD-10-CM | POA: Diagnosis not present

## 2021-11-30 DIAGNOSIS — J440 Chronic obstructive pulmonary disease with acute lower respiratory infection: Secondary | ICD-10-CM | POA: Diagnosis not present

## 2021-11-30 DIAGNOSIS — I10 Essential (primary) hypertension: Secondary | ICD-10-CM | POA: Diagnosis not present

## 2021-11-30 DIAGNOSIS — I739 Peripheral vascular disease, unspecified: Secondary | ICD-10-CM | POA: Diagnosis not present

## 2021-11-30 DIAGNOSIS — E039 Hypothyroidism, unspecified: Secondary | ICD-10-CM | POA: Diagnosis not present

## 2021-11-30 DIAGNOSIS — F17210 Nicotine dependence, cigarettes, uncomplicated: Secondary | ICD-10-CM | POA: Diagnosis not present

## 2021-11-30 DIAGNOSIS — Z299 Encounter for prophylactic measures, unspecified: Secondary | ICD-10-CM | POA: Diagnosis not present

## 2021-12-06 DIAGNOSIS — K635 Polyp of colon: Secondary | ICD-10-CM | POA: Diagnosis not present

## 2021-12-06 DIAGNOSIS — E039 Hypothyroidism, unspecified: Secondary | ICD-10-CM | POA: Diagnosis not present

## 2021-12-06 DIAGNOSIS — D121 Benign neoplasm of appendix: Secondary | ICD-10-CM | POA: Diagnosis not present

## 2021-12-06 DIAGNOSIS — K621 Rectal polyp: Secondary | ICD-10-CM | POA: Diagnosis not present

## 2021-12-06 DIAGNOSIS — Z1211 Encounter for screening for malignant neoplasm of colon: Secondary | ICD-10-CM | POA: Diagnosis not present

## 2021-12-06 DIAGNOSIS — Z800 Family history of malignant neoplasm of digestive organs: Secondary | ICD-10-CM | POA: Diagnosis not present

## 2021-12-06 DIAGNOSIS — F17210 Nicotine dependence, cigarettes, uncomplicated: Secondary | ICD-10-CM | POA: Diagnosis not present

## 2021-12-06 DIAGNOSIS — Z79899 Other long term (current) drug therapy: Secondary | ICD-10-CM | POA: Diagnosis not present

## 2021-12-06 DIAGNOSIS — K5730 Diverticulosis of large intestine without perforation or abscess without bleeding: Secondary | ICD-10-CM | POA: Diagnosis not present

## 2021-12-06 DIAGNOSIS — I2510 Atherosclerotic heart disease of native coronary artery without angina pectoris: Secondary | ICD-10-CM | POA: Diagnosis not present

## 2021-12-06 DIAGNOSIS — J449 Chronic obstructive pulmonary disease, unspecified: Secondary | ICD-10-CM | POA: Diagnosis not present

## 2021-12-06 DIAGNOSIS — I10 Essential (primary) hypertension: Secondary | ICD-10-CM | POA: Diagnosis not present

## 2021-12-06 DIAGNOSIS — D128 Benign neoplasm of rectum: Secondary | ICD-10-CM | POA: Diagnosis not present

## 2021-12-06 DIAGNOSIS — I25119 Atherosclerotic heart disease of native coronary artery with unspecified angina pectoris: Secondary | ICD-10-CM | POA: Diagnosis not present

## 2021-12-10 DIAGNOSIS — Z79891 Long term (current) use of opiate analgesic: Secondary | ICD-10-CM | POA: Diagnosis not present

## 2021-12-10 DIAGNOSIS — G894 Chronic pain syndrome: Secondary | ICD-10-CM | POA: Diagnosis not present

## 2021-12-10 DIAGNOSIS — M4722 Other spondylosis with radiculopathy, cervical region: Secondary | ICD-10-CM | POA: Diagnosis not present

## 2021-12-10 DIAGNOSIS — M961 Postlaminectomy syndrome, not elsewhere classified: Secondary | ICD-10-CM | POA: Diagnosis not present

## 2021-12-31 DIAGNOSIS — D126 Benign neoplasm of colon, unspecified: Secondary | ICD-10-CM | POA: Insufficient documentation

## 2022-01-03 DIAGNOSIS — H269 Unspecified cataract: Secondary | ICD-10-CM | POA: Diagnosis not present

## 2022-01-03 DIAGNOSIS — H25811 Combined forms of age-related cataract, right eye: Secondary | ICD-10-CM | POA: Diagnosis not present

## 2022-01-31 DIAGNOSIS — H269 Unspecified cataract: Secondary | ICD-10-CM | POA: Diagnosis not present

## 2022-01-31 DIAGNOSIS — H25812 Combined forms of age-related cataract, left eye: Secondary | ICD-10-CM | POA: Diagnosis not present

## 2022-02-05 DIAGNOSIS — H1013 Acute atopic conjunctivitis, bilateral: Secondary | ICD-10-CM | POA: Diagnosis not present

## 2022-02-19 DIAGNOSIS — I10 Essential (primary) hypertension: Secondary | ICD-10-CM | POA: Diagnosis not present

## 2022-02-19 DIAGNOSIS — J069 Acute upper respiratory infection, unspecified: Secondary | ICD-10-CM | POA: Diagnosis not present

## 2022-02-19 DIAGNOSIS — Z299 Encounter for prophylactic measures, unspecified: Secondary | ICD-10-CM | POA: Diagnosis not present

## 2022-03-05 DIAGNOSIS — G894 Chronic pain syndrome: Secondary | ICD-10-CM | POA: Diagnosis not present

## 2022-03-05 DIAGNOSIS — M4722 Other spondylosis with radiculopathy, cervical region: Secondary | ICD-10-CM | POA: Diagnosis not present

## 2022-03-05 DIAGNOSIS — Z79891 Long term (current) use of opiate analgesic: Secondary | ICD-10-CM | POA: Diagnosis not present

## 2022-03-05 DIAGNOSIS — M961 Postlaminectomy syndrome, not elsewhere classified: Secondary | ICD-10-CM | POA: Diagnosis not present

## 2022-04-17 DIAGNOSIS — I10 Essential (primary) hypertension: Secondary | ICD-10-CM | POA: Diagnosis not present

## 2022-04-17 DIAGNOSIS — J449 Chronic obstructive pulmonary disease, unspecified: Secondary | ICD-10-CM | POA: Diagnosis not present

## 2022-05-16 DIAGNOSIS — I10 Essential (primary) hypertension: Secondary | ICD-10-CM | POA: Diagnosis not present

## 2022-05-16 DIAGNOSIS — Z0000 Encounter for general adult medical examination without abnormal findings: Secondary | ICD-10-CM | POA: Diagnosis not present

## 2022-05-16 DIAGNOSIS — Z681 Body mass index (BMI) 19 or less, adult: Secondary | ICD-10-CM | POA: Diagnosis not present

## 2022-05-16 DIAGNOSIS — J449 Chronic obstructive pulmonary disease, unspecified: Secondary | ICD-10-CM | POA: Diagnosis not present

## 2022-05-16 DIAGNOSIS — I739 Peripheral vascular disease, unspecified: Secondary | ICD-10-CM | POA: Diagnosis not present

## 2022-05-16 DIAGNOSIS — R64 Cachexia: Secondary | ICD-10-CM | POA: Diagnosis not present

## 2022-05-16 DIAGNOSIS — Z299 Encounter for prophylactic measures, unspecified: Secondary | ICD-10-CM | POA: Diagnosis not present

## 2022-05-16 DIAGNOSIS — F17210 Nicotine dependence, cigarettes, uncomplicated: Secondary | ICD-10-CM | POA: Diagnosis not present

## 2022-05-16 DIAGNOSIS — Z7189 Other specified counseling: Secondary | ICD-10-CM | POA: Diagnosis not present

## 2022-06-03 DIAGNOSIS — Z79891 Long term (current) use of opiate analgesic: Secondary | ICD-10-CM | POA: Diagnosis not present

## 2022-06-03 DIAGNOSIS — M961 Postlaminectomy syndrome, not elsewhere classified: Secondary | ICD-10-CM | POA: Diagnosis not present

## 2022-06-03 DIAGNOSIS — M4722 Other spondylosis with radiculopathy, cervical region: Secondary | ICD-10-CM | POA: Diagnosis not present

## 2022-06-03 DIAGNOSIS — G894 Chronic pain syndrome: Secondary | ICD-10-CM | POA: Diagnosis not present

## 2022-07-31 DIAGNOSIS — J3489 Other specified disorders of nose and nasal sinuses: Secondary | ICD-10-CM | POA: Diagnosis not present

## 2022-07-31 DIAGNOSIS — Z299 Encounter for prophylactic measures, unspecified: Secondary | ICD-10-CM | POA: Diagnosis not present

## 2022-07-31 DIAGNOSIS — J449 Chronic obstructive pulmonary disease, unspecified: Secondary | ICD-10-CM | POA: Diagnosis not present

## 2022-07-31 DIAGNOSIS — I10 Essential (primary) hypertension: Secondary | ICD-10-CM | POA: Diagnosis not present

## 2022-07-31 DIAGNOSIS — I700 Atherosclerosis of aorta: Secondary | ICD-10-CM | POA: Diagnosis not present

## 2022-08-21 DIAGNOSIS — Z79899 Other long term (current) drug therapy: Secondary | ICD-10-CM | POA: Diagnosis not present

## 2022-08-21 DIAGNOSIS — E7800 Pure hypercholesterolemia, unspecified: Secondary | ICD-10-CM | POA: Diagnosis not present

## 2022-08-21 DIAGNOSIS — Z299 Encounter for prophylactic measures, unspecified: Secondary | ICD-10-CM | POA: Diagnosis not present

## 2022-08-21 DIAGNOSIS — Z0000 Encounter for general adult medical examination without abnormal findings: Secondary | ICD-10-CM | POA: Diagnosis not present

## 2022-08-21 DIAGNOSIS — R5383 Other fatigue: Secondary | ICD-10-CM | POA: Diagnosis not present

## 2022-08-21 DIAGNOSIS — F17210 Nicotine dependence, cigarettes, uncomplicated: Secondary | ICD-10-CM | POA: Diagnosis not present

## 2022-08-21 DIAGNOSIS — I10 Essential (primary) hypertension: Secondary | ICD-10-CM | POA: Diagnosis not present

## 2022-08-21 DIAGNOSIS — E039 Hypothyroidism, unspecified: Secondary | ICD-10-CM | POA: Diagnosis not present

## 2022-09-02 DIAGNOSIS — Z79891 Long term (current) use of opiate analgesic: Secondary | ICD-10-CM | POA: Diagnosis not present

## 2022-09-02 DIAGNOSIS — G894 Chronic pain syndrome: Secondary | ICD-10-CM | POA: Diagnosis not present

## 2022-09-02 DIAGNOSIS — M961 Postlaminectomy syndrome, not elsewhere classified: Secondary | ICD-10-CM | POA: Diagnosis not present

## 2022-09-02 DIAGNOSIS — M4722 Other spondylosis with radiculopathy, cervical region: Secondary | ICD-10-CM | POA: Diagnosis not present

## 2022-10-17 DIAGNOSIS — I10 Essential (primary) hypertension: Secondary | ICD-10-CM | POA: Diagnosis not present

## 2022-10-17 DIAGNOSIS — I25118 Atherosclerotic heart disease of native coronary artery with other forms of angina pectoris: Secondary | ICD-10-CM | POA: Diagnosis not present

## 2022-10-22 DIAGNOSIS — Z299 Encounter for prophylactic measures, unspecified: Secondary | ICD-10-CM | POA: Diagnosis not present

## 2022-10-22 DIAGNOSIS — E039 Hypothyroidism, unspecified: Secondary | ICD-10-CM | POA: Diagnosis not present

## 2022-10-22 DIAGNOSIS — R64 Cachexia: Secondary | ICD-10-CM | POA: Diagnosis not present

## 2022-10-22 DIAGNOSIS — F17210 Nicotine dependence, cigarettes, uncomplicated: Secondary | ICD-10-CM | POA: Diagnosis not present

## 2022-10-22 DIAGNOSIS — I700 Atherosclerosis of aorta: Secondary | ICD-10-CM | POA: Diagnosis not present

## 2022-10-22 DIAGNOSIS — I10 Essential (primary) hypertension: Secondary | ICD-10-CM | POA: Diagnosis not present

## 2022-11-20 DIAGNOSIS — J449 Chronic obstructive pulmonary disease, unspecified: Secondary | ICD-10-CM | POA: Diagnosis not present

## 2022-11-20 DIAGNOSIS — J069 Acute upper respiratory infection, unspecified: Secondary | ICD-10-CM | POA: Diagnosis not present

## 2022-11-20 DIAGNOSIS — R0981 Nasal congestion: Secondary | ICD-10-CM | POA: Diagnosis not present

## 2022-11-20 DIAGNOSIS — J209 Acute bronchitis, unspecified: Secondary | ICD-10-CM | POA: Diagnosis not present

## 2022-11-20 DIAGNOSIS — Z299 Encounter for prophylactic measures, unspecified: Secondary | ICD-10-CM | POA: Diagnosis not present

## 2022-12-02 DIAGNOSIS — M961 Postlaminectomy syndrome, not elsewhere classified: Secondary | ICD-10-CM | POA: Diagnosis not present

## 2022-12-02 DIAGNOSIS — G894 Chronic pain syndrome: Secondary | ICD-10-CM | POA: Diagnosis not present

## 2022-12-02 DIAGNOSIS — M4722 Other spondylosis with radiculopathy, cervical region: Secondary | ICD-10-CM | POA: Diagnosis not present

## 2022-12-02 DIAGNOSIS — Z79891 Long term (current) use of opiate analgesic: Secondary | ICD-10-CM | POA: Diagnosis not present

## 2022-12-05 DIAGNOSIS — I10 Essential (primary) hypertension: Secondary | ICD-10-CM | POA: Diagnosis not present

## 2022-12-05 DIAGNOSIS — R0602 Shortness of breath: Secondary | ICD-10-CM | POA: Diagnosis not present

## 2022-12-05 DIAGNOSIS — E039 Hypothyroidism, unspecified: Secondary | ICD-10-CM | POA: Diagnosis not present

## 2022-12-05 DIAGNOSIS — Z299 Encounter for prophylactic measures, unspecified: Secondary | ICD-10-CM | POA: Diagnosis not present

## 2022-12-23 DIAGNOSIS — Z1231 Encounter for screening mammogram for malignant neoplasm of breast: Secondary | ICD-10-CM | POA: Diagnosis not present

## 2022-12-30 DIAGNOSIS — R042 Hemoptysis: Secondary | ICD-10-CM | POA: Diagnosis not present

## 2022-12-30 DIAGNOSIS — R0982 Postnasal drip: Secondary | ICD-10-CM | POA: Diagnosis not present

## 2022-12-30 DIAGNOSIS — F17210 Nicotine dependence, cigarettes, uncomplicated: Secondary | ICD-10-CM | POA: Diagnosis not present

## 2022-12-30 DIAGNOSIS — Z299 Encounter for prophylactic measures, unspecified: Secondary | ICD-10-CM | POA: Diagnosis not present

## 2022-12-30 DIAGNOSIS — J069 Acute upper respiratory infection, unspecified: Secondary | ICD-10-CM | POA: Diagnosis not present

## 2022-12-30 DIAGNOSIS — R5383 Other fatigue: Secondary | ICD-10-CM | POA: Diagnosis not present

## 2022-12-30 DIAGNOSIS — J449 Chronic obstructive pulmonary disease, unspecified: Secondary | ICD-10-CM | POA: Diagnosis not present

## 2022-12-30 DIAGNOSIS — I10 Essential (primary) hypertension: Secondary | ICD-10-CM | POA: Diagnosis not present

## 2022-12-30 DIAGNOSIS — R062 Wheezing: Secondary | ICD-10-CM | POA: Diagnosis not present

## 2022-12-30 DIAGNOSIS — J849 Interstitial pulmonary disease, unspecified: Secondary | ICD-10-CM | POA: Diagnosis not present

## 2022-12-30 DIAGNOSIS — R059 Cough, unspecified: Secondary | ICD-10-CM | POA: Diagnosis not present

## 2023-01-01 DIAGNOSIS — Z299 Encounter for prophylactic measures, unspecified: Secondary | ICD-10-CM | POA: Diagnosis not present

## 2023-01-01 DIAGNOSIS — J849 Interstitial pulmonary disease, unspecified: Secondary | ICD-10-CM | POA: Diagnosis not present

## 2023-01-01 DIAGNOSIS — I10 Essential (primary) hypertension: Secondary | ICD-10-CM | POA: Diagnosis not present

## 2023-01-01 DIAGNOSIS — F17210 Nicotine dependence, cigarettes, uncomplicated: Secondary | ICD-10-CM | POA: Diagnosis not present

## 2023-01-01 DIAGNOSIS — R042 Hemoptysis: Secondary | ICD-10-CM | POA: Diagnosis not present

## 2023-01-06 DIAGNOSIS — J439 Emphysema, unspecified: Secondary | ICD-10-CM | POA: Diagnosis not present

## 2023-01-06 DIAGNOSIS — R918 Other nonspecific abnormal finding of lung field: Secondary | ICD-10-CM | POA: Diagnosis not present

## 2023-01-06 DIAGNOSIS — R911 Solitary pulmonary nodule: Secondary | ICD-10-CM | POA: Diagnosis not present

## 2023-01-06 DIAGNOSIS — I700 Atherosclerosis of aorta: Secondary | ICD-10-CM | POA: Diagnosis not present

## 2023-01-06 DIAGNOSIS — J432 Centrilobular emphysema: Secondary | ICD-10-CM | POA: Diagnosis not present

## 2023-01-06 DIAGNOSIS — I728 Aneurysm of other specified arteries: Secondary | ICD-10-CM | POA: Diagnosis not present

## 2023-01-06 DIAGNOSIS — F17200 Nicotine dependence, unspecified, uncomplicated: Secondary | ICD-10-CM | POA: Diagnosis not present

## 2023-01-06 DIAGNOSIS — I7121 Aneurysm of the ascending aorta, without rupture: Secondary | ICD-10-CM | POA: Diagnosis not present

## 2023-01-08 ENCOUNTER — Encounter: Payer: Self-pay | Admitting: Internal Medicine

## 2023-01-08 ENCOUNTER — Ambulatory Visit: Payer: Medicare Other | Admitting: Internal Medicine

## 2023-01-08 VITALS — BP 97/61 | HR 82 | Ht 62.0 in | Wt 109.0 lb

## 2023-01-08 DIAGNOSIS — J449 Chronic obstructive pulmonary disease, unspecified: Secondary | ICD-10-CM | POA: Diagnosis not present

## 2023-01-08 DIAGNOSIS — F17210 Nicotine dependence, cigarettes, uncomplicated: Secondary | ICD-10-CM | POA: Diagnosis not present

## 2023-01-08 NOTE — Assessment & Plan Note (Addendum)
4-5 min discussion re active cigarette smoking in addition to office E&M  Ask about tobacco use:   ongoing Advise quitting     I reviewed the Fletcher curve with the patient that basically indicates that  if you quit smoking when your best day FEV1 is still well preserved (as is likely   the case here)  it is highly unlikely you will progress to severe disease and informed the patient there was  no medication on the market that has proven to alter the curve/ its downward trajectory  or the likelihood of progression of their disease(unlike other chronic medical conditions such as atheroclerosis where we do think we can change the natural hx with risk reducing meds)    Therefore stopping smoking and maintaining abstinence are  the most important aspects of her care, not choice of inhalers or for that matter, Pulmonary doctors.   Treatment other than smoking cessation  is entirely directed by severity of symptoms and focused also on reducing exacerbations, not attempting to change the natural history of the disease. Should either issue become problematic, then escalation of maintenance and prn resp meds may be warranted "the punishment needs to fit the crime"   Assess willingness:  Not committed at this point Assist in quit attempt:  Per PCP when ready Arrange follow up:   Follow up per Primary Care planned

## 2023-01-08 NOTE — Patient Instructions (Addendum)
For cough/ congestion > mucinex or mucinex dm  up to maximum of  1200 mg every 12 hours as needed   Please remember to go to the lab department   for your tests - we will call you with the results when they are available.     The key is to stop smoking completely before smoking completely stops you!    My office will be contacting you by phone for referral to PFTs at [facility]   - if you don't hear back from my office within one week please call us back or notify us thru MyChart and we'll address it right away.    Please schedule a follow up visit in 3 months but call sooner if needed

## 2023-01-08 NOTE — Assessment & Plan Note (Addendum)
Active smoker - CT 01/06/23 : Moderate to severe emphysematous changes - Allergy screen 01/08/2023 >  Eos 0.  / alpha One Phenotype  - 01/08/2023   Walked on RA  x  3  lap(s) =  approx 450  ft  @ moderately fast pace, stopped due to end of study s sob  with lowest 02 sats 95%   She got thru the most recent flare of bronchitis easily s needing inhalers so she clearly doesn't need on now and instead would like her to focus on stopping smoking and more regular exercise as tol pending f/u with pfts w/in 3 months, call sooner if needed   Discussed in detail all the  indications, usual  risks and alternatives  relative to the benefits with patient who agrees to proceed with w/u as outlined.     Each maintenance medication was reviewed in detail including emphasizing most importantly the difference between maintenance and prns and under what circumstances the prns are to be triggered using an action plan format where appropriate.  Total time for H and P, chart review, counseling,  directly observing portions of ambulatory 02 saturation study/ and generating customized AVS unique to this office visit / same day charting = 30 min new pt eval

## 2023-01-08 NOTE — Progress Notes (Signed)
Shannon Glover, female    DOB: 02-Sep-1955    MRN: [id number]   Brief patient profile:  8  yowf  active smoker from [place] moved to [place] 1976 worked in Dentist until early 2000  referred to pulmonary clinic in [place]  01/08/2023 by [name] for doe x 2022 progressive with covid essentially asymptomatic.       History of Present Illness  01/08/2023  Pulmonary/ 1st office eval/ Eben Choinski / [place] [facility]  Chief Complaint  Patient presents with   Establish Care  Dyspnea:  [facility] = can't walk a nl pace on a flat grade s sob but does fine slow and flat and has to stop at top of 15 steps at daughter's house  Cough: some rattling recent hemoptysis better p abx > still a little discolored p levaqin 01/04/23  Sleep: flat bed 3 pillows  SABA use: none  02: none   No obvious day to day or daytime pattern/variability or assoc   mucus plugs or hemoptysis or cp or chest tightness, subjective wheeze or overt sinus or hb symptoms.    Also denies any obvious fluctuation of symptoms with weather or environmental changes or other aggravating or alleviating factors except as outlined above   No unusual exposure hx or h/o childhood pna/ asthma or knowledge of premature birth.  Current Allergies, Complete Past Medical History, Past Surgical History, Family History, and Social History were reviewed in [employer] record.  ROS  The following are not active complaints unless bolded Hoarseness, sore throat, dysphagia, dental problems, itching, sneezing,  nasal congestion or discharge of excess mucus or purulent secretions, ear ache,   fever, chills, sweats, unintended wt loss or wt gain, classically pleuritic or exertional cp,  orthopnea pnd or arm/hand swelling  or leg swelling, presyncope, palpitations, abdominal pain, anorexia, nausea, vomiting, diarrhea  or change in bowel habits or change in bladder habits, change in stools or change in urine, dysuria, hematuria,   rash, arthralgias, visual complaints, headache, numbness, weakness or ataxia or problems with walking or coordination,  change in mood or  memory.              Outpatient Medications Prior to Visit  Medication Sig Dispense Refill   esomeprazole (NEXIUM) 40 MG capsule Take 40 mg by mouth daily.     levofloxacin (LEVAQUIN) 500 MG tablet Take 500 mg by mouth daily.     levothyroxine (SYNTHROID) 75 MCG tablet Take 75 mcg by mouth daily.     nitroGLYCERIN (NITROSTAT) 0.4 MG SL tablet Place 0.4 mg under the tongue every 5 (five) minutes as needed.     rosuvastatin (CRESTOR) 10 MG tablet Take 10 mg by mouth daily.     traMADol (ULTRAM) 50 MG tablet Take 50 mg by mouth 4 (four) times daily as needed.     Vitamin D, Ergocalciferol, (DRISDOL) 1.25 MG (50000 UNIT) CAPS capsule Take 50,000 Units by mouth once a week.     doxycycline (VIBRAMYCIN) 100 MG capsule Never took it      Aspirin-Salicylamide-Caffeine (BC HEADACHE POWDER PO) Take by mouth.     famotidine (PEPCID) 40 MG tablet Take 40 mg by mouth daily. (Patient not taking: Reported on 01/08/2023)     metoprolol succinate (TOPROL-XL) 25 MG 24 hr tablet Take 25 mg by mouth daily.     Gabapentin, Once-Daily, 600 MG TABS Take 600 mg by mouth at bedtime.     levothyroxine (SYNTHROID, LEVOTHROID) 100 MCG tablet Take 100 mcg  by mouth daily before breakfast.     tapentadol (NUCYNTA) 50 MG TABS tablet Take 50 mg by mouth 2 (two) times daily before a meal.     No facility-administered medications prior to visit.    Past Medical History:  Diagnosis Date   Back pain    Constipation    HTN (hypertension)    Hypothyroidism    Osteopetrosis    Thyroid disease       Objective:     BP 97/61   Pulse 82   Ht 5\' 2"  (1.575 m)   Wt 109 lb (49.4 kg)   SpO2 96%   BMI 19.94 kg/m   SpO2: 96 % somber amb wf nad    HEENT : Oropharynx  clear   Nasal turbinates nl    NECK :  without  apparent JVD/ palpable Nodes/TM    LUNGS: no acc muscle use,   Min barrel  contour chest wall with bilateral  slightly decreased bs s audible wheeze and  without cough on insp or exp maneuvers and min  Hyperresonant  to  percussion bilaterally    CV:  RRR  no s3 or murmur or increase in P2, and no edema   ABD:  soft and nontender with pos very end  insp Hoover's  in the supine position.  No bruits or organomegaly appreciated   MS:  Nl gait/ ext warm without deformities Or obvious joint restrictions  calf tenderness, cyanosis or clubbing     SKIN: warm and dry without lesions    NEURO:  alert, approp, nl sensorium with  no motor or cerebellar deficits apparent.           CT w/o contrast  01/06/23 1. Moderate to severe emphysematous changes with slightly increased  scarring at the lung apices.  2. No acute infectious or inflammatory process in the lungs. No  pleural effusions.  3. Ascending thoracic aorta is mildly aneurysmal measuring 4.0 cm  and stable. Recommend annual imaging followup by CTA or MRA. This  recommendation follows 2010  ACCF/AHA/AATS/ACR/ASA/SCA/SCAI/SIR/STS/SVM Guidelines for the  Diagnosis and Management of Patients with Thoracic Aortic Disease.  Circulation. 2010; 121: W413-K440. Aortic aneurysm NOS (ICD10-I71.9)  4. New 3 mm nodule in the left upper lobe. 12 month follow-up chest  CT is recommended based on emphysema and smoking history. This  recommendation follows the consensus statement: Guidelines for  Management of Incidental Pulmonary Nodules Detected on CT Images:  From the Fleischner Society 2017; Radiology 2017; 284:228-243.  5. Aortic Atherosclerosis (ICD10-I70.0) and Emphysema (ICD10-J43.9).  6. 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   Assessment   COPD GOLD? / emphysema on CT Active smoker - CT 01/06/23 : Moderate to severe emphysematous changes - Allergy screen 01/08/2023 >  Eos 0.  / alpha One Phenotype  - 01/08/2023   Walked on RA  x  3  lap(s) =  approx 450  ft  @ moderately fast pace,  stopped due to end of study s sob  with lowest 02 sats 95%   She got thru the most recent flare of bronchitis easily s needing inhalers so she clearly doesn't need on now and instead would like her to focus on stopping smoking and more regular exercise as tol pending f/u with pfts w/in 3 months, call sooner if needed   Discussed in detail all the  indications, usual  risks and alternatives  relative to the benefits with patient who agrees to proceed with w/u as outlined.  Each maintenance medication was reviewed in detail including emphasizing most importantly the difference between maintenance and prns and under what circumstances the prns are to be triggered using an action plan format where appropriate.  Total time for H and P, chart review, counseling,  directly observing portions of ambulatory 02 saturation study/ and generating customized AVS unique to this office visit / same day charting = 30 min new pt eval                 Cigarette smoker 4-5 min discussion re active cigarette smoking in addition to office E&M  Ask about tobacco use:   ongoing Advise quitting     I reviewed the Fletcher curve with the patient that basically indicates that  if you quit smoking when your best day FEV1 is still well preserved (as is likely   the case here)  it is highly unlikely you will progress to severe disease and informed the patient there was  no medication on the market that has proven to alter the curve/ its downward trajectory  or the likelihood of progression of their disease(unlike other chronic medical conditions such as atheroclerosis where we do think we can change the natural hx with risk reducing meds)    Therefore stopping smoking and maintaining abstinence are  the most important aspects of her care, not choice of inhalers or for that matter, Pulmonary doctors.   Treatment other than smoking cessation  is entirely directed by severity of symptoms and focused also on reducing  exacerbations, not attempting to change the natural history of the disease. Should either issue become problematic, then escalation of maintenance and prn resp meds may be warranted "the punishment needs to fit the crime"   Assess willingness:  Not committed at this point Assist in quit attempt:  Per PCP when ready Arrange follow up:   Follow up per Primary Care planned  For smoking cessation classes call [phone]         Sandrea Hughs, MD 01/08/2023

## 2023-01-09 ENCOUNTER — Telehealth: Payer: Self-pay | Admitting: Internal Medicine

## 2023-01-09 NOTE — Telephone Encounter (Signed)
Spoke with patient's daughter, with pt permission, and relayed message from [facility]. Nothing further needed at this time.

## 2023-01-09 NOTE — Telephone Encounter (Signed)
Shannon Glover was seen for the first time yesterday by Dr. Sherene Sires and she understood him to say she had mild emphysema.  Patient wants so make sure Dr. Sherene Sires saw her most recent Chest CT that was done on 01/06/23 at [facility] in [place]----those results stated she had severe emphysema---Please advise----[phone]

## 2023-01-09 NOTE — Telephone Encounter (Signed)
What I said was that the emphysema was the visual problem which has nothing to do directly to do with how the lung functions which I hoped/ believed, based on her history and exam,  was better than the scan and the COPD was of a milder nature > that's why we do pfts, then we can regroup

## 2023-01-09 NOTE — Telephone Encounter (Signed)
Please advise, thank you.

## 2023-01-14 LAB — CBC WITH DIFFERENTIAL/PLATELET
Basophils Absolute: 0.1 10*3/uL (ref 0.0–0.2)
Basos: 2 %
EOS (ABSOLUTE): 0.3 10*3/uL (ref 0.0–0.4)
Eos: 4 %
Hematocrit: 37 % (ref 34.0–46.6)
Hemoglobin: 12.1 g/dL (ref 11.1–15.9)
Immature Grans (Abs): 0 10*3/uL (ref 0.0–0.1)
Immature Granulocytes: 0 %
Lymphocytes Absolute: 2.3 10*3/uL (ref 0.7–3.1)
Lymphs: 32 %
MCH: 29 pg (ref 26.6–33.0)
MCHC: 32.7 g/dL (ref 31.5–35.7)
MCV: 89 fL (ref 79–97)
Monocytes Absolute: 0.7 10*3/uL (ref 0.1–0.9)
Monocytes: 10 %
Neutrophils Absolute: 3.8 10*3/uL (ref 1.4–7.0)
Neutrophils: 52 %
Platelets: 218 10*3/uL (ref 150–450)
RBC: 4.17 x10E6/uL (ref 3.77–5.28)
RDW: 14 % (ref 11.7–15.4)
WBC: 7.2 10*3/uL (ref 3.4–10.8)

## 2023-01-14 LAB — BASIC METABOLIC PANEL
BUN/Creatinine Ratio: 31 — ABNORMAL HIGH (ref 12–28)
BUN: 26 mg/dL (ref 8–27)
CO2: 19 mmol/L — ABNORMAL LOW (ref 20–29)
Calcium: 9.1 mg/dL (ref 8.7–10.3)
Chloride: 108 mmol/L — ABNORMAL HIGH (ref 96–106)
Creatinine, Ser: 0.83 mg/dL (ref 0.57–1.00)
Glucose: 89 mg/dL (ref 70–99)
Potassium: 4.1 mmol/L (ref 3.5–5.2)
Sodium: 142 mmol/L (ref 134–144)
eGFR: 78 mL/min/{1.73_m2} (ref >59)

## 2023-01-14 LAB — ALPHA-1-ANTITRYPSIN PHENOTYP: A-1 Antitrypsin: 144 mg/dL (ref 101–187)

## 2023-01-21 DIAGNOSIS — E039 Hypothyroidism, unspecified: Secondary | ICD-10-CM | POA: Diagnosis not present

## 2023-01-21 DIAGNOSIS — Z299 Encounter for prophylactic measures, unspecified: Secondary | ICD-10-CM | POA: Diagnosis not present

## 2023-01-21 DIAGNOSIS — R64 Cachexia: Secondary | ICD-10-CM | POA: Diagnosis not present

## 2023-01-21 DIAGNOSIS — F17210 Nicotine dependence, cigarettes, uncomplicated: Secondary | ICD-10-CM | POA: Diagnosis not present

## 2023-01-21 DIAGNOSIS — I10 Essential (primary) hypertension: Secondary | ICD-10-CM | POA: Diagnosis not present

## 2023-02-07 ENCOUNTER — Institutional Professional Consult (permissible substitution): Payer: Medicare Other | Admitting: Internal Medicine

## 2023-02-20 ENCOUNTER — Ambulatory Visit (HOSPITAL_COMMUNITY)
Admission: RE | Admit: 2023-02-20 | Discharge: 2023-02-20 | Disposition: A | Discharge status: Home / Self Care | Payer: Medicare Other | Source: Ambulatory Visit | Attending: Internal Medicine | Admitting: Internal Medicine

## 2023-02-20 DIAGNOSIS — J449 Chronic obstructive pulmonary disease, unspecified: Secondary | ICD-10-CM | POA: Diagnosis not present

## 2023-02-20 LAB — PULMONARY FUNCTION TEST
DL/VA % pred: 47 %
DL/VA: 2.01 ml/min/mmHg/L
DLCO unc % pred: 47 %
DLCO unc: 8.54 ml/min/mmHg
FEF 25-75 Post: 1.27 L/s
FEF 25-75 Pre: 1.03 L/s
FEF2575-%Change-Post: 23 %
FEF2575-%Pred-Post: 65 %
FEF2575-%Pred-Pre: 53 %
FEV1-%Change-Post: 3 %
FEV1-%Pred-Post: 94 %
FEV1-%Pred-Pre: 90 %
FEV1-Post: 2.04 L
FEV1-Pre: 1.97 L
FEV1FVC-%Change-Post: 2 %
FEV1FVC-%Pred-Pre: 84 %
FEV6-%Change-Post: 0 %
FEV6-%Pred-Post: 110 %
FEV6-%Pred-Pre: 109 %
FEV6-Post: 3.01 L
FEV6-Pre: 3 L
FEV6FVC-%Change-Post: 0 %
FEV6FVC-%Pred-Post: 103 %
FEV6FVC-%Pred-Pre: 103 %
FVC-%Change-Post: 1 %
FVC-%Pred-Post: 107 %
FVC-%Pred-Pre: 106 %
FVC-Post: 3.08 L
FVC-Pre: 3.03 L
Post FEV1/FVC ratio: 66 %
Post FEV6/FVC ratio: 100 %
Pre FEV1/FVC ratio: 65 %
Pre FEV6/FVC Ratio: 99 %
RV % pred: 85 %
RV: 1.73 L
TLC % pred: 97 %
TLC: 4.64 L

## 2023-02-20 MED ORDER — ALBUTEROL SULFATE (2.5 MG/3ML) 0.083% IN NEBU
2.5000 mg | INHALATION_SOLUTION | Freq: Once | RESPIRATORY_TRACT | Status: AC
  Administered 2023-02-20: 2.5 mg via RESPIRATORY_TRACT

## 2023-02-24 DIAGNOSIS — M961 Postlaminectomy syndrome, not elsewhere classified: Secondary | ICD-10-CM | POA: Diagnosis not present

## 2023-02-24 DIAGNOSIS — G894 Chronic pain syndrome: Secondary | ICD-10-CM | POA: Diagnosis not present

## 2023-02-24 DIAGNOSIS — Z79891 Long term (current) use of opiate analgesic: Secondary | ICD-10-CM | POA: Diagnosis not present

## 2023-02-24 DIAGNOSIS — M4722 Other spondylosis with radiculopathy, cervical region: Secondary | ICD-10-CM | POA: Diagnosis not present

## 2023-04-13 NOTE — Progress Notes (Unsigned)
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     History of Present Illness  01/08/2023  Pulmonary/ 1st Gl[facility] eval/ Shannon Glover / Shannon Glover  Chief Complaint  Patient presents with   Establish Care  Dyspnea:  [facility] = can't walk a nl pace on a flat grade s sob but does fine slow and flat and has to stop at top of 15 steps at daughter's house  Cough: some rattling recent hemoptysis better p abx > still a little discolored p levaqin 01/04/23  Sleep: flat bed 3 pillows  SABA use: none  02: none  Rec For cough/ congestion > mucinex or mucinex dm  up to maximum of  1200 mg every 12 hours as needed  Please remember to go to the lab department   for your tests - we will call you with the results when they are available. The key is to stop smoking completely before smoking completely stops you! My Glover will be contacting you by phone for referral to PFTs at [facility]      04/14/2023  f/u ov/Shannon Glover/Shannon Glover re: GOLD 1  maint on no Rx  Chief Complaint  Patient presents with   Follow-up  Dyspnea:  main problem = steps  Cough: minimal am rattling but nonproductive  Sleeping: flat bed 3 pillows s  resp cc  SABA use: none  02: none  Lung cancer screening: 01/06/23 mod / severe emphysema    No obvious day to day or daytime variability or assoc excess/ purulent sputum or mucus plugs or hemoptysis or cp or chest tightness, subjective wheeze or overt sinus or hb symptoms.    Also denies any obvious fluctuation of symptoms with weather or environmental changes or other aggravating or alleviating factors except as outlined above   No unusual exposure hx or h/o childhood pna/ asthma or knowledge of premature  birth.  Current Allergies, Complete Past Medical History, Past Surgical History, Family History, and Social History were reviewed in [facility] record.  ROS  The following are not active complaints unless bolded Hoarseness, sore throat, dysphagia, dental problems, itching, sneezing,  nasal congestion or discharge of excess mucus or purulent secretions, ear ache,   fever, chills, sweats, unintended wt loss or wt gain, classically pleuritic or exertional cp,  orthopnea pnd or arm/hand swelling  or leg swelling, presyncope, palpitations, abdominal pain, anorexia, nausea, vomiting, diarrhea  or change in bowel habits or change in bladder habits, change in stools or change in urine, dysuria, hematuria,  rash, arthralgias, visual complaints, headache, numbness, weakness or ataxia or problems with walking or coordination,  change in mood or  memory.        Current Meds  Medication Sig   esomeprazole (NEXIUM) 40 MG capsule Take 40 mg by mouth daily.   levothyroxine (SYNTHROID) 75 MCG tablet Take 75 mcg by mouth daily.   metoprolol succinate (TOPROL-XL) 25 MG 24 hr tablet Take 25 mg by mouth daily.   nitroGLYCERIN (NITROSTAT) 0.4 MG SL tablet Place 0.4 mg under the tongue every 5 (five) minutes as needed.   rosuvastatin (CRESTOR) 10 MG tablet Take 10 mg by mouth daily.   traMADol (ULTRAM) 50 MG  tablet Take 50 mg by mouth 4 (four) times daily as needed.           Past Medical History:  Diagnosis Date   Back pain    Constipation    HTN (hypertension)    Hypothyroidism    Osteopetrosis    Thyroid disease       Objective:     Wt Readings from Last 3 Encounters:  04/14/23 108 lb (49 kg)  01/08/23 109 lb (49.4 kg)  10/18/15 128 lb (58.1 kg)      Vital signs reviewed  04/14/2023  - Note at rest 02 sats  96% on RA   General appearance:    somber wf nad    HEENT : Oropharynx  clear   Nasal turbinates nl    NECK :  without  apparent JVD/ palpable Nodes/TM     LUNGS: no acc muscle use,  Min barrel  contour chest wall with bilateral  slightly decreased bs s audible wheeze and  without cough on insp or exp maneuvers and min  Hyperresonant  to  percussion bilaterally    CV:  RRR  no s3 or murmur or increase in P2, and no edema   ABD:  soft and nontender with pos end  insp Hoover's  in the supine position.  No bruits or organomegaly appreciated   MS:  Nl gait/ ext warm without deformities Or obvious joint restrictions  calf tenderness, cyanosis or clubbing     SKIN: warm and dry without lesions    NEURO:  alert, approp, nl sensorium with  no motor or cerebellar deficits apparent.           CT w/o contrast  01/06/23 1. Moderate to severe emphysematous changes with slightly increased  scarring at the lung apices.  2. No acute infectious or inflammatory process in the lungs. No  pleural effusions.  3. Ascending thoracic aorta is mildly aneurysmal measuring 4.0 cm  and stable. Recommend annual imaging followup by CTA or MRA. This  recommendation follows 2010  ACCF/AHA/AATS/ACR/ASA/SCA/SCAI/SIR/STS/SVM Guidelines for the  Diagnosis and Management of Patients with Thoracic Aortic Disease.  Circulation. 2010; 121: Z610-R604. Aortic aneurysm NOS (ICD10-I71.9)  4. New 3 mm nodule in the left upper lobe. 12 month follow-up chest  CT is recommended based on emphysema and smoking history. This  recommendation follows the consensus statement: Guidelines for  Management of Incidental Pulmonary Nodules Detected on CT Images:  From the Fleischner Society 2017; Radiology 2017; 284:228-243.  5. Aortic Atherosclerosis (ICD10-I70.0) and Emphysema (ICD10-J43.9).  6. 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   Assessment

## 2023-04-14 ENCOUNTER — Ambulatory Visit: Payer: Medicare Other | Admitting: Internal Medicine

## 2023-04-14 ENCOUNTER — Encounter: Payer: Self-pay | Admitting: Internal Medicine

## 2023-04-14 VITALS — BP 96/65 | HR 84 | Ht 62.0 in | Wt 108.0 lb

## 2023-04-14 DIAGNOSIS — J449 Chronic obstructive pulmonary disease, unspecified: Secondary | ICD-10-CM | POA: Diagnosis not present

## 2023-04-14 DIAGNOSIS — F17210 Nicotine dependence, cigarettes, uncomplicated: Secondary | ICD-10-CM

## 2023-04-14 NOTE — Patient Instructions (Signed)
The key is to stop smoking completely before smoking completely stops you- it's not too late  Pulmonary follow up is as needed

## 2023-04-15 ENCOUNTER — Encounter: Payer: Self-pay | Admitting: Internal Medicine

## 2023-04-15 NOTE — Assessment & Plan Note (Signed)
Counseled re importance of smoking cessation but did not meet time criteria for separate billing     Pulmonary f/u is prn   Annual LDSCT per PCP          Each maintenance medication was reviewed in detail including emphasizing most importantly the difference between maintenance and prns and under what circumstances the prns are to be triggered using an action plan format where appropriate.  Total time for H and P, chart review, counseling, reviewing hfa device(s) and generating customized AVS unique to this office visit / same day charting = 20 mi summary final pulmonary f/u

## 2023-04-15 NOTE — Assessment & Plan Note (Addendum)
Active smoker/MM - CT 01/06/23 : Moderate to severe emphysematous changes - Allergy screen 01/08/2023 >  Eos 0. 3 / alpha One Phenotype MM  level 144  - 01/08/2023   Walked on RA  x  3  lap(s) =  approx 450  ft  @ moderately fast pace, stopped due to end of study s sob  with lowest 02 sats 95%  - PFT's  02/20/23   FEV1 2.04 (94% ) ratio 0.66  p 3% improvement from saba p 0 prior to study with DLCO  8.54 (47%)   and FV curve min concave    Despite CT chest findings,  she is not limited by doe or having aecopd flares though she is at risk from smoking so rec saba prn and f/u pulmonary prn

## 2023-04-17 DIAGNOSIS — R300 Dysuria: Secondary | ICD-10-CM | POA: Diagnosis not present

## 2023-04-17 DIAGNOSIS — N390 Urinary tract infection, site not specified: Secondary | ICD-10-CM | POA: Diagnosis not present

## 2023-04-17 DIAGNOSIS — Z299 Encounter for prophylactic measures, unspecified: Secondary | ICD-10-CM | POA: Diagnosis not present

## 2023-04-17 DIAGNOSIS — I10 Essential (primary) hypertension: Secondary | ICD-10-CM | POA: Diagnosis not present

## 2023-04-17 DIAGNOSIS — R109 Unspecified abdominal pain: Secondary | ICD-10-CM | POA: Diagnosis not present

## 2023-04-17 DIAGNOSIS — I739 Peripheral vascular disease, unspecified: Secondary | ICD-10-CM | POA: Diagnosis not present

## 2023-04-17 DIAGNOSIS — F17210 Nicotine dependence, cigarettes, uncomplicated: Secondary | ICD-10-CM | POA: Diagnosis not present

## 2023-05-19 DIAGNOSIS — I700 Atherosclerosis of aorta: Secondary | ICD-10-CM | POA: Diagnosis not present

## 2023-05-19 DIAGNOSIS — Z7189 Other specified counseling: Secondary | ICD-10-CM | POA: Diagnosis not present

## 2023-05-19 DIAGNOSIS — Z299 Encounter for prophylactic measures, unspecified: Secondary | ICD-10-CM | POA: Diagnosis not present

## 2023-05-19 DIAGNOSIS — Z0000 Encounter for general adult medical examination without abnormal findings: Secondary | ICD-10-CM | POA: Diagnosis not present

## 2023-05-19 DIAGNOSIS — I739 Peripheral vascular disease, unspecified: Secondary | ICD-10-CM | POA: Diagnosis not present

## 2023-05-19 DIAGNOSIS — I10 Essential (primary) hypertension: Secondary | ICD-10-CM | POA: Diagnosis not present

## 2023-05-19 DIAGNOSIS — F17210 Nicotine dependence, cigarettes, uncomplicated: Secondary | ICD-10-CM | POA: Diagnosis not present

## 2023-05-19 DIAGNOSIS — J449 Chronic obstructive pulmonary disease, unspecified: Secondary | ICD-10-CM | POA: Diagnosis not present

## 2023-05-26 DIAGNOSIS — M961 Postlaminectomy syndrome, not elsewhere classified: Secondary | ICD-10-CM | POA: Diagnosis not present

## 2023-05-26 DIAGNOSIS — G894 Chronic pain syndrome: Secondary | ICD-10-CM | POA: Diagnosis not present

## 2023-05-26 DIAGNOSIS — Z79891 Long term (current) use of opiate analgesic: Secondary | ICD-10-CM | POA: Diagnosis not present

## 2023-05-26 DIAGNOSIS — M4722 Other spondylosis with radiculopathy, cervical region: Secondary | ICD-10-CM | POA: Diagnosis not present

## 2023-08-25 DIAGNOSIS — G894 Chronic pain syndrome: Secondary | ICD-10-CM | POA: Diagnosis not present

## 2023-08-25 DIAGNOSIS — M961 Postlaminectomy syndrome, not elsewhere classified: Secondary | ICD-10-CM | POA: Diagnosis not present

## 2023-08-25 DIAGNOSIS — Z79891 Long term (current) use of opiate analgesic: Secondary | ICD-10-CM | POA: Diagnosis not present

## 2023-08-25 DIAGNOSIS — M4722 Other spondylosis with radiculopathy, cervical region: Secondary | ICD-10-CM | POA: Diagnosis not present

## 2023-08-29 DIAGNOSIS — I10 Essential (primary) hypertension: Secondary | ICD-10-CM | POA: Diagnosis not present

## 2023-08-29 DIAGNOSIS — Z79899 Other long term (current) drug therapy: Secondary | ICD-10-CM | POA: Diagnosis not present

## 2023-08-29 DIAGNOSIS — R5383 Other fatigue: Secondary | ICD-10-CM | POA: Diagnosis not present

## 2023-08-29 DIAGNOSIS — Z0000 Encounter for general adult medical examination without abnormal findings: Secondary | ICD-10-CM | POA: Diagnosis not present

## 2023-08-29 DIAGNOSIS — Z299 Encounter for prophylactic measures, unspecified: Secondary | ICD-10-CM | POA: Diagnosis not present

## 2023-08-29 DIAGNOSIS — E039 Hypothyroidism, unspecified: Secondary | ICD-10-CM | POA: Diagnosis not present

## 2023-08-29 DIAGNOSIS — E7800 Pure hypercholesterolemia, unspecified: Secondary | ICD-10-CM | POA: Diagnosis not present

## 2023-08-29 DIAGNOSIS — F17210 Nicotine dependence, cigarettes, uncomplicated: Secondary | ICD-10-CM | POA: Diagnosis not present

## 2023-10-07 DIAGNOSIS — Z299 Encounter for prophylactic measures, unspecified: Secondary | ICD-10-CM | POA: Diagnosis not present

## 2023-10-07 DIAGNOSIS — R110 Nausea: Secondary | ICD-10-CM | POA: Diagnosis not present

## 2023-10-07 DIAGNOSIS — J449 Chronic obstructive pulmonary disease, unspecified: Secondary | ICD-10-CM | POA: Diagnosis not present

## 2023-10-07 DIAGNOSIS — E039 Hypothyroidism, unspecified: Secondary | ICD-10-CM | POA: Diagnosis not present

## 2023-10-07 DIAGNOSIS — I10 Essential (primary) hypertension: Secondary | ICD-10-CM | POA: Diagnosis not present

## 2023-10-20 DIAGNOSIS — Z79891 Long term (current) use of opiate analgesic: Secondary | ICD-10-CM | POA: Diagnosis not present

## 2023-10-20 DIAGNOSIS — M4722 Other spondylosis with radiculopathy, cervical region: Secondary | ICD-10-CM | POA: Diagnosis not present

## 2023-10-20 DIAGNOSIS — M961 Postlaminectomy syndrome, not elsewhere classified: Secondary | ICD-10-CM | POA: Diagnosis not present

## 2023-10-20 DIAGNOSIS — G894 Chronic pain syndrome: Secondary | ICD-10-CM | POA: Diagnosis not present

## 2023-12-08 DIAGNOSIS — K219 Gastro-esophageal reflux disease without esophagitis: Secondary | ICD-10-CM | POA: Diagnosis not present

## 2023-12-08 DIAGNOSIS — R110 Nausea: Secondary | ICD-10-CM | POA: Diagnosis not present

## 2023-12-08 DIAGNOSIS — Z299 Encounter for prophylactic measures, unspecified: Secondary | ICD-10-CM | POA: Diagnosis not present

## 2023-12-08 DIAGNOSIS — I10 Essential (primary) hypertension: Secondary | ICD-10-CM | POA: Diagnosis not present

## 2023-12-12 DIAGNOSIS — R55 Syncope and collapse: Secondary | ICD-10-CM | POA: Diagnosis not present

## 2023-12-12 DIAGNOSIS — I2510 Atherosclerotic heart disease of native coronary artery without angina pectoris: Secondary | ICD-10-CM | POA: Diagnosis not present

## 2023-12-12 DIAGNOSIS — I6523 Occlusion and stenosis of bilateral carotid arteries: Secondary | ICD-10-CM | POA: Diagnosis not present

## 2023-12-12 DIAGNOSIS — F17210 Nicotine dependence, cigarettes, uncomplicated: Secondary | ICD-10-CM | POA: Diagnosis not present

## 2023-12-12 DIAGNOSIS — R42 Dizziness and giddiness: Secondary | ICD-10-CM | POA: Diagnosis not present

## 2023-12-12 DIAGNOSIS — I672 Cerebral atherosclerosis: Secondary | ICD-10-CM | POA: Diagnosis not present

## 2023-12-12 DIAGNOSIS — I10 Essential (primary) hypertension: Secondary | ICD-10-CM | POA: Diagnosis not present

## 2023-12-12 DIAGNOSIS — Z888 Allergy status to other drugs, medicaments and biological substances status: Secondary | ICD-10-CM | POA: Diagnosis not present

## 2023-12-12 DIAGNOSIS — M542 Cervicalgia: Secondary | ICD-10-CM | POA: Diagnosis not present

## 2023-12-12 DIAGNOSIS — Z79899 Other long term (current) drug therapy: Secondary | ICD-10-CM | POA: Diagnosis not present

## 2023-12-12 DIAGNOSIS — W548XXA Other contact with dog, initial encounter: Secondary | ICD-10-CM | POA: Diagnosis not present

## 2023-12-12 DIAGNOSIS — S0990XA Unspecified injury of head, initial encounter: Secondary | ICD-10-CM | POA: Diagnosis not present

## 2023-12-12 DIAGNOSIS — J449 Chronic obstructive pulmonary disease, unspecified: Secondary | ICD-10-CM | POA: Diagnosis not present

## 2023-12-16 DIAGNOSIS — Z299 Encounter for prophylactic measures, unspecified: Secondary | ICD-10-CM | POA: Diagnosis not present

## 2023-12-16 DIAGNOSIS — M546 Pain in thoracic spine: Secondary | ICD-10-CM | POA: Diagnosis not present

## 2023-12-16 DIAGNOSIS — I10 Essential (primary) hypertension: Secondary | ICD-10-CM | POA: Diagnosis not present

## 2023-12-16 DIAGNOSIS — S0990XA Unspecified injury of head, initial encounter: Secondary | ICD-10-CM | POA: Diagnosis not present

## 2023-12-16 DIAGNOSIS — R519 Headache, unspecified: Secondary | ICD-10-CM | POA: Diagnosis not present

## 2023-12-19 DIAGNOSIS — F17210 Nicotine dependence, cigarettes, uncomplicated: Secondary | ICD-10-CM | POA: Diagnosis not present

## 2023-12-19 DIAGNOSIS — Z299 Encounter for prophylactic measures, unspecified: Secondary | ICD-10-CM | POA: Diagnosis not present

## 2023-12-19 DIAGNOSIS — I10 Essential (primary) hypertension: Secondary | ICD-10-CM | POA: Diagnosis not present

## 2023-12-19 DIAGNOSIS — J449 Chronic obstructive pulmonary disease, unspecified: Secondary | ICD-10-CM | POA: Diagnosis not present

## 2023-12-19 DIAGNOSIS — M810 Age-related osteoporosis without current pathological fracture: Secondary | ICD-10-CM | POA: Diagnosis not present

## 2023-12-25 DIAGNOSIS — Z1231 Encounter for screening mammogram for malignant neoplasm of breast: Secondary | ICD-10-CM | POA: Diagnosis not present
